# Patient Record
Sex: Male | Born: 1975
Health system: Southern US, Community
[De-identification: ages and names within clinical notes are randomized; demographics above are authoritative.]

## PROBLEM LIST (undated history)

## (undated) DIAGNOSIS — E291 Testicular hypofunction: Secondary | ICD-10-CM

## (undated) DIAGNOSIS — E785 Hyperlipidemia, unspecified: Secondary | ICD-10-CM

## (undated) DIAGNOSIS — K219 Gastro-esophageal reflux disease without esophagitis: Secondary | ICD-10-CM

## (undated) DIAGNOSIS — T7840XA Allergy, unspecified, initial encounter: Secondary | ICD-10-CM

## (undated) HISTORY — PX: OTHER SURGICAL HISTORY: SHX169

## (undated) HISTORY — DX: Allergy, unspecified, initial encounter: T78.40XA

## (undated) HISTORY — DX: Testicular hypofunction: E29.1

## (undated) HISTORY — DX: Hyperlipidemia, unspecified: E78.5

## (undated) HISTORY — DX: Gastro-esophageal reflux disease without esophagitis: K21.9

---

## 1998-02-28 ENCOUNTER — Encounter: Admission: RE | Admit: 1998-02-28 | Discharge: 1998-02-28 | Payer: Self-pay | Admitting: Family Medicine

## 1998-03-04 ENCOUNTER — Encounter: Admission: RE | Admit: 1998-03-04 | Discharge: 1998-03-04 | Payer: Self-pay | Admitting: Family Medicine

## 1998-03-07 ENCOUNTER — Ambulatory Visit (HOSPITAL_COMMUNITY): Admission: RE | Admit: 1998-03-07 | Discharge: 1998-03-07 | Payer: Self-pay | Admitting: *Deleted

## 1998-03-10 ENCOUNTER — Encounter: Admission: RE | Admit: 1998-03-10 | Discharge: 1998-03-10 | Payer: Self-pay | Admitting: Family Medicine

## 1998-03-23 ENCOUNTER — Encounter: Admission: RE | Admit: 1998-03-23 | Discharge: 1998-03-23 | Payer: Self-pay | Admitting: Family Medicine

## 1998-04-18 ENCOUNTER — Encounter: Admission: RE | Admit: 1998-04-18 | Discharge: 1998-04-18 | Payer: Self-pay | Admitting: Family Medicine

## 1998-04-20 ENCOUNTER — Ambulatory Visit (HOSPITAL_COMMUNITY): Admission: RE | Admit: 1998-04-20 | Discharge: 1998-04-20 | Payer: Self-pay | Admitting: Family Medicine

## 1998-04-20 ENCOUNTER — Encounter: Admission: RE | Admit: 1998-04-20 | Discharge: 1998-04-20 | Payer: Self-pay | Admitting: Family Medicine

## 1999-01-23 ENCOUNTER — Encounter: Admission: RE | Admit: 1999-01-23 | Discharge: 1999-01-23 | Payer: Self-pay | Admitting: Sports Medicine

## 1999-11-15 ENCOUNTER — Encounter: Admission: RE | Admit: 1999-11-15 | Discharge: 1999-11-15 | Payer: Self-pay | Admitting: Family Medicine

## 1999-11-22 ENCOUNTER — Encounter: Admission: RE | Admit: 1999-11-22 | Discharge: 1999-11-22 | Payer: Self-pay | Admitting: Family Medicine

## 1999-12-01 ENCOUNTER — Encounter: Admission: RE | Admit: 1999-12-01 | Discharge: 1999-12-01 | Payer: Self-pay | Admitting: Family Medicine

## 2002-06-12 ENCOUNTER — Encounter: Admission: RE | Admit: 2002-06-12 | Discharge: 2002-06-12 | Payer: Self-pay | Admitting: Family Medicine

## 2002-08-07 ENCOUNTER — Encounter: Admission: RE | Admit: 2002-08-07 | Discharge: 2002-08-07 | Payer: Self-pay | Admitting: Family Medicine

## 2002-08-21 ENCOUNTER — Encounter: Admission: RE | Admit: 2002-08-21 | Discharge: 2002-08-21 | Payer: Self-pay | Admitting: Family Medicine

## 2005-01-07 ENCOUNTER — Emergency Department (HOSPITAL_COMMUNITY): Admission: EM | Admit: 2005-01-07 | Discharge: 2005-01-07 | Payer: Self-pay | Admitting: Emergency Medicine

## 2005-01-18 ENCOUNTER — Ambulatory Visit: Payer: Self-pay | Admitting: Family Medicine

## 2005-01-26 ENCOUNTER — Ambulatory Visit (HOSPITAL_COMMUNITY): Admission: RE | Admit: 2005-01-26 | Discharge: 2005-01-26 | Payer: Self-pay | Admitting: Oral Surgery

## 2005-09-07 ENCOUNTER — Ambulatory Visit: Payer: Self-pay | Admitting: Family Medicine

## 2005-11-15 ENCOUNTER — Ambulatory Visit: Payer: Self-pay | Admitting: Family Medicine

## 2005-12-05 ENCOUNTER — Ambulatory Visit: Payer: Self-pay | Admitting: Family Medicine

## 2005-12-12 ENCOUNTER — Ambulatory Visit: Payer: Self-pay | Admitting: Family Medicine

## 2006-01-27 IMAGING — CR DG CHEST 2V
2 series · 2 of 2 positions shown · non-contrast
Comparison: None.

CLINICAL DATA: Chest pain, shortness of breath.  Left-sided numbness.  Non smoker.

[view not recorded (1 of 2)]
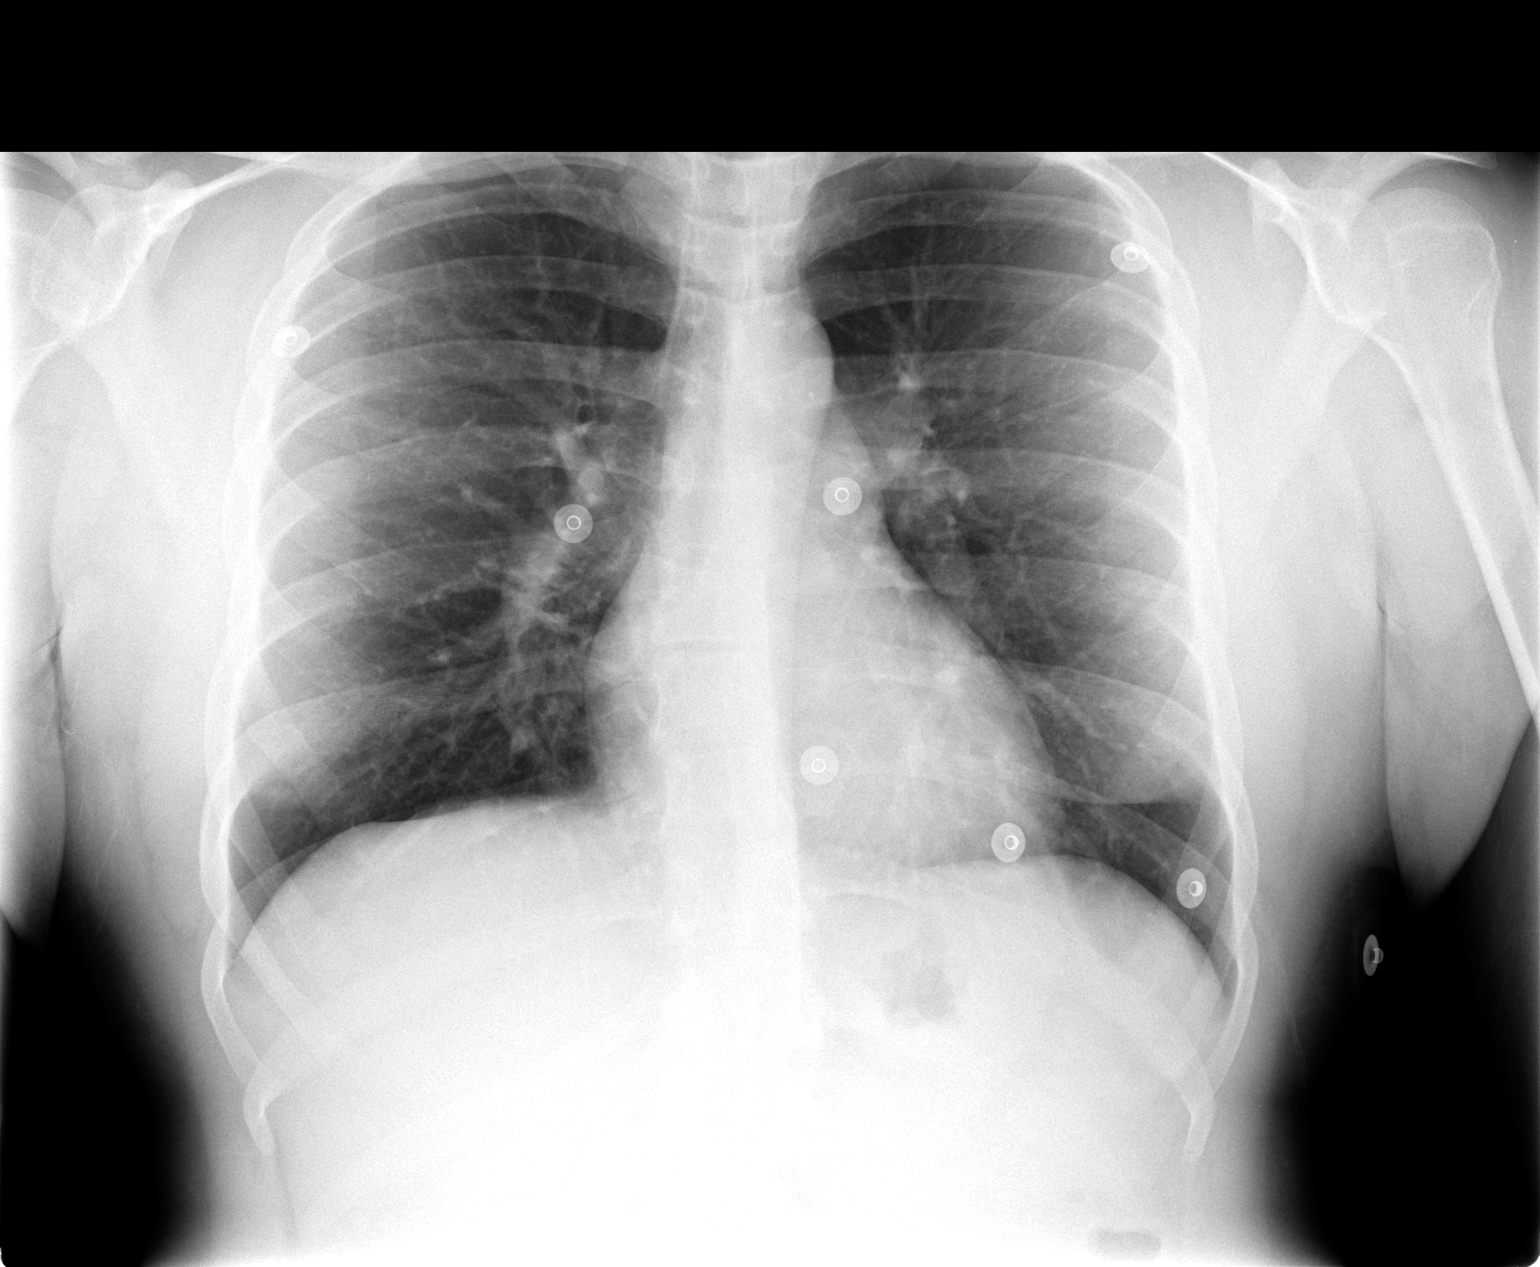

[view not recorded (2 of 2)]
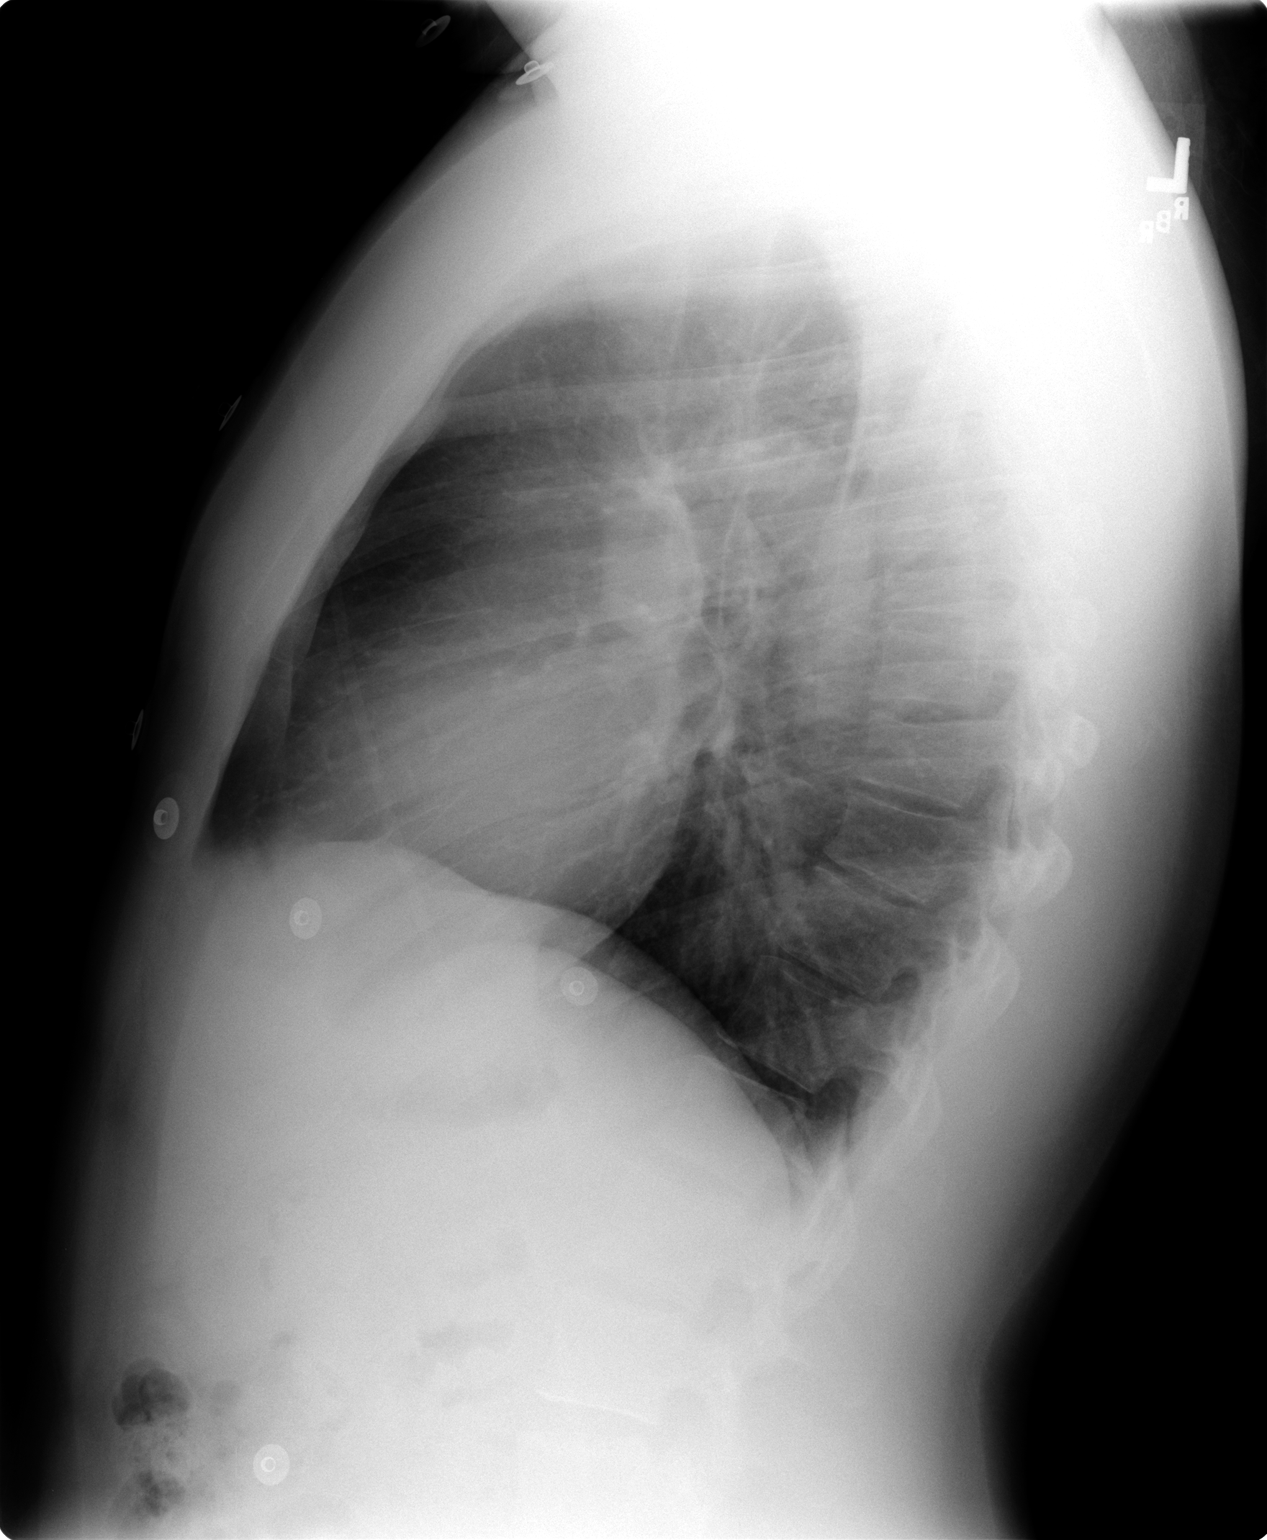

[2 of 2 positions shown; findings below may reference images not displayed]

CHEST - 2 VIEWS:
 There are no focal lung opacities.  No pleural effusions or pneumothorax.  Review of osseous structures is unremarkable.
IMPRESSION: Normal chest.

## 2006-04-29 ENCOUNTER — Ambulatory Visit: Payer: Self-pay | Admitting: Family Medicine

## 2006-08-27 ENCOUNTER — Ambulatory Visit: Payer: Self-pay | Admitting: Family Medicine

## 2006-09-09 ENCOUNTER — Ambulatory Visit: Payer: Self-pay | Admitting: Family Medicine

## 2006-09-09 LAB — CONVERTED CEMR LAB
Chol/HDL Ratio, serum: 5.9
Cholesterol: 208 mg/dL (ref 0–200)
HDL: 35.4 mg/dL — ABNORMAL LOW (ref >39.0)
LDL DIRECT: 146.7 mg/dL
Triglyceride fasting, serum: 84 mg/dL (ref 0–149)
VLDL: 17 mg/dL (ref 0–40)

## 2007-01-30 ENCOUNTER — Emergency Department (HOSPITAL_COMMUNITY): Admission: EM | Admit: 2007-01-30 | Discharge: 2007-01-30 | Payer: Self-pay | Admitting: Emergency Medicine

## 2007-08-22 ENCOUNTER — Ambulatory Visit: Payer: Self-pay | Admitting: Family Medicine

## 2007-08-22 LAB — CONVERTED CEMR LAB
Bilirubin Urine: NEGATIVE
Glucose, Urine, Semiquant: NEGATIVE
Ketones, urine, test strip: NEGATIVE
Nitrite: NEGATIVE
Protein, U semiquant: NEGATIVE
Specific Gravity, Urine: 1.015
Urobilinogen, UA: NEGATIVE
WBC Urine, dipstick: NEGATIVE
pH: >=9

## 2007-08-25 LAB — CONVERTED CEMR LAB
ALT: 21 units/L (ref 0–53)
AST: 23 units/L (ref 0–37)
Albumin: 4.6 g/dL (ref 3.5–5.2)
Alkaline Phosphatase: 35 units/L — ABNORMAL LOW (ref 39–117)
BUN: 11 mg/dL (ref 6–23)
Basophils Absolute: 0 10*3/uL (ref 0.0–0.1)
Basophils Relative: 0.1 % (ref 0.0–1.0)
Bilirubin, Direct: 0.2 mg/dL (ref 0.0–0.3)
CO2: 32 meq/L (ref 19–32)
Calcium: 10.1 mg/dL (ref 8.4–10.5)
Chloride: 105 meq/L (ref 96–112)
Cholesterol: 203 mg/dL (ref 0–200)
Creatinine, Ser: 1.3 mg/dL (ref 0.4–1.5)
Direct LDL: 155.2 mg/dL
Eosinophils Absolute: 0.3 10*3/uL (ref 0.0–0.6)
Eosinophils Relative: 4.9 % (ref 0.0–5.0)
GFR calc Af Amer: 85 mL/min
GFR calc non Af Amer: 70 mL/min
Glucose, Bld: 92 mg/dL (ref 70–99)
HCT: 42.5 % (ref 39.0–52.0)
HDL: 39.2 mg/dL (ref >39.0)
Hemoglobin: 14.4 g/dL (ref 13.0–17.0)
Lymphocytes Relative: 17.9 % (ref 12.0–46.0)
MCHC: 33.8 g/dL (ref 30.0–36.0)
MCV: 89.3 fL (ref 78.0–100.0)
Monocytes Absolute: 0.5 10*3/uL (ref 0.2–0.7)
Monocytes Relative: 6.9 % (ref 3.0–11.0)
Neutro Abs: 5 10*3/uL (ref 1.4–7.7)
Neutrophils Relative %: 70.2 % (ref 43.0–77.0)
Platelets: 250 10*3/uL (ref 150–400)
Potassium: 4.3 meq/L (ref 3.5–5.1)
RBC: 4.76 M/uL (ref 4.22–5.81)
RDW: 12.5 % (ref 11.5–14.6)
Sodium: 141 meq/L (ref 135–145)
TSH: 1.25 microintl units/mL (ref 0.35–5.50)
Total Bilirubin: 1.2 mg/dL (ref 0.3–1.2)
Total CHOL/HDL Ratio: 5.2
Total Protein: 7 g/dL (ref 6.0–8.3)
Triglycerides: 57 mg/dL (ref 0–149)
VLDL: 11 mg/dL (ref 0–40)
WBC: 7.1 10*3/uL (ref 4.5–10.5)

## 2007-09-02 DIAGNOSIS — E785 Hyperlipidemia, unspecified: Secondary | ICD-10-CM | POA: Insufficient documentation

## 2007-10-06 ENCOUNTER — Ambulatory Visit: Payer: Self-pay | Admitting: Family Medicine

## 2007-10-06 DIAGNOSIS — J309 Allergic rhinitis, unspecified: Secondary | ICD-10-CM | POA: Insufficient documentation

## 2007-10-06 DIAGNOSIS — Z87442 Personal history of urinary calculi: Secondary | ICD-10-CM | POA: Insufficient documentation

## 2007-10-06 DIAGNOSIS — Z9189 Other specified personal risk factors, not elsewhere classified: Secondary | ICD-10-CM | POA: Insufficient documentation

## 2008-01-07 ENCOUNTER — Ambulatory Visit: Payer: Self-pay | Admitting: Family Medicine

## 2008-01-07 LAB — CONVERTED CEMR LAB
ALT: 25 units/L (ref 0–53)
AST: 23 units/L (ref 0–37)
Albumin: 4.5 g/dL (ref 3.5–5.2)
Alkaline Phosphatase: 32 units/L — ABNORMAL LOW (ref 39–117)
Bilirubin, Direct: 0.2 mg/dL (ref 0.0–0.3)
Cholesterol: 143 mg/dL (ref 0–200)
HDL: 38 mg/dL — ABNORMAL LOW (ref >39.0)
LDL Cholesterol: 96 mg/dL (ref 0–99)
Total Bilirubin: 1.2 mg/dL (ref 0.3–1.2)
Total CHOL/HDL Ratio: 3.8
Total Protein: 7.2 g/dL (ref 6.0–8.3)
Triglycerides: 43 mg/dL (ref 0–149)
VLDL: 9 mg/dL (ref 0–40)

## 2008-05-13 ENCOUNTER — Ambulatory Visit: Payer: Self-pay | Admitting: Family Medicine

## 2008-05-13 DIAGNOSIS — R079 Chest pain, unspecified: Secondary | ICD-10-CM

## 2008-05-31 ENCOUNTER — Telehealth: Payer: Self-pay | Admitting: Internal Medicine

## 2008-06-09 ENCOUNTER — Telehealth: Payer: Self-pay | Admitting: Family Medicine

## 2009-02-10 ENCOUNTER — Ambulatory Visit: Payer: Self-pay | Admitting: Family Medicine

## 2009-02-16 ENCOUNTER — Encounter: Payer: Self-pay | Admitting: Family Medicine

## 2009-02-16 LAB — CONVERTED CEMR LAB
ALT: 49 units/L (ref 0–53)
AST: 27 units/L (ref 0–37)
Albumin: 4.8 g/dL (ref 3.5–5.2)
Alkaline Phosphatase: 38 units/L — ABNORMAL LOW (ref 39–117)
BUN: 14 mg/dL (ref 6–23)
Basophils Absolute: 0 10*3/uL (ref 0.0–0.1)
Basophils Relative: 0.4 % (ref 0.0–3.0)
Bilirubin Urine: NEGATIVE
Bilirubin, Direct: 0.2 mg/dL (ref 0.0–0.3)
CO2: 33 meq/L — ABNORMAL HIGH (ref 19–32)
Calcium: 10.2 mg/dL (ref 8.4–10.5)
Chloride: 102 meq/L (ref 96–112)
Cholesterol: 173 mg/dL (ref 0–200)
Creatinine, Ser: 1.2 mg/dL (ref 0.4–1.5)
Eosinophils Absolute: 0.2 10*3/uL (ref 0.0–0.7)
Eosinophils Relative: 3.7 % (ref 0.0–5.0)
GFR calc non Af Amer: 91.21 mL/min (ref >60)
Glucose, Bld: 102 mg/dL — ABNORMAL HIGH (ref 70–99)
HCT: 45.2 % (ref 39.0–52.0)
HDL: 40.5 mg/dL (ref >39.00)
Hemoglobin, Urine: NEGATIVE
Hemoglobin: 15.2 g/dL (ref 13.0–17.0)
Ketones, ur: NEGATIVE mg/dL
LDL Cholesterol: 122 mg/dL — ABNORMAL HIGH (ref 0–99)
Leukocytes, UA: NEGATIVE
Lymphocytes Relative: 24.2 % (ref 12.0–46.0)
Lymphs Abs: 1.4 10*3/uL (ref 0.7–4.0)
MCHC: 33.6 g/dL (ref 30.0–36.0)
MCV: 88.6 fL (ref 78.0–100.0)
Monocytes Absolute: 0.4 10*3/uL (ref 0.1–1.0)
Monocytes Relative: 7.4 % (ref 3.0–12.0)
Neutro Abs: 3.9 10*3/uL (ref 1.4–7.7)
Neutrophils Relative %: 64.3 % (ref 43.0–77.0)
Nitrite: NEGATIVE
Platelets: 204 10*3/uL (ref 150.0–400.0)
Potassium: 4.2 meq/L (ref 3.5–5.1)
RBC: 5.1 M/uL (ref 4.22–5.81)
RDW: 12.5 % (ref 11.5–14.6)
Sodium: 140 meq/L (ref 135–145)
Specific Gravity, Urine: 1.015 (ref 1.000–1.030)
TSH: 1.04 microintl units/mL (ref 0.35–5.50)
Total Bilirubin: 1 mg/dL (ref 0.3–1.2)
Total CHOL/HDL Ratio: 4
Total Protein, Urine: NEGATIVE mg/dL
Total Protein: 7.7 g/dL (ref 6.0–8.3)
Triglycerides: 55 mg/dL (ref 0.0–149.0)
Urine Glucose: NEGATIVE mg/dL
Urobilinogen, UA: 0.2 (ref 0.0–1.0)
VLDL: 11 mg/dL (ref 0.0–40.0)
WBC: 5.9 10*3/uL (ref 4.5–10.5)
pH: 6 (ref 5.0–8.0)

## 2009-04-21 ENCOUNTER — Ambulatory Visit: Payer: Self-pay | Admitting: Family Medicine

## 2009-04-21 DIAGNOSIS — K219 Gastro-esophageal reflux disease without esophagitis: Secondary | ICD-10-CM | POA: Insufficient documentation

## 2009-04-21 DIAGNOSIS — G4730 Sleep apnea, unspecified: Secondary | ICD-10-CM | POA: Insufficient documentation

## 2009-04-21 DIAGNOSIS — N433 Hydrocele, unspecified: Secondary | ICD-10-CM | POA: Insufficient documentation

## 2009-05-23 ENCOUNTER — Ambulatory Visit: Payer: Self-pay | Admitting: Family Medicine

## 2009-05-23 DIAGNOSIS — N410 Acute prostatitis: Secondary | ICD-10-CM | POA: Insufficient documentation

## 2009-05-23 LAB — CONVERTED CEMR LAB
Bilirubin Urine: NEGATIVE
Blood in Urine, dipstick: NEGATIVE
Glucose, Urine, Semiquant: NEGATIVE
Ketones, urine, test strip: NEGATIVE
Nitrite: NEGATIVE
Specific Gravity, Urine: 1.02
Urobilinogen, UA: 0.2
WBC Urine, dipstick: NEGATIVE
pH: 6

## 2009-09-14 ENCOUNTER — Telehealth: Payer: Self-pay | Admitting: Family Medicine

## 2009-11-02 ENCOUNTER — Telehealth (INDEPENDENT_AMBULATORY_CARE_PROVIDER_SITE_OTHER): Payer: Self-pay | Admitting: *Deleted

## 2009-11-18 ENCOUNTER — Ambulatory Visit: Payer: Self-pay | Admitting: Family Medicine

## 2009-11-23 ENCOUNTER — Encounter: Payer: Self-pay | Admitting: Family Medicine

## 2010-05-08 ENCOUNTER — Ambulatory Visit: Payer: Self-pay | Admitting: Family Medicine

## 2010-05-08 DIAGNOSIS — N508 Other specified disorders of male genital organs: Secondary | ICD-10-CM | POA: Insufficient documentation

## 2010-05-08 LAB — CONVERTED CEMR LAB
Bilirubin Urine: NEGATIVE
Blood in Urine, dipstick: NEGATIVE
Glucose, Urine, Semiquant: NEGATIVE
Ketones, urine, test strip: NEGATIVE
Nitrite: NEGATIVE
Protein, U semiquant: NEGATIVE
Specific Gravity, Urine: >=1.03
Urobilinogen, UA: 0.2
WBC Urine, dipstick: NEGATIVE
pH: 5.5

## 2010-05-09 LAB — CONVERTED CEMR LAB
ALT: 31 units/L (ref 0–53)
AST: 23 units/L (ref 0–37)
Albumin: 4.9 g/dL (ref 3.5–5.2)
Alkaline Phosphatase: 41 units/L (ref 39–117)
BUN: 18 mg/dL (ref 6–23)
Basophils Absolute: 0 10*3/uL (ref 0.0–0.1)
Basophils Relative: 0.4 % (ref 0.0–3.0)
Bilirubin, Direct: 0.2 mg/dL (ref 0.0–0.3)
CO2: 30 meq/L (ref 19–32)
Calcium: 9.7 mg/dL (ref 8.4–10.5)
Chloride: 104 meq/L (ref 96–112)
Cholesterol: 161 mg/dL (ref 0–200)
Creatinine, Ser: 1.3 mg/dL (ref 0.4–1.5)
Eosinophils Absolute: 0.3 10*3/uL (ref 0.0–0.7)
Eosinophils Relative: 5.4 % — ABNORMAL HIGH (ref 0.0–5.0)
GFR calc non Af Amer: 85.52 mL/min (ref >60)
Glucose, Bld: 95 mg/dL (ref 70–99)
HCT: 43.1 % (ref 39.0–52.0)
HDL: 41.2 mg/dL (ref >39.00)
Hemoglobin: 14.6 g/dL (ref 13.0–17.0)
LDL Cholesterol: 106 mg/dL — ABNORMAL HIGH (ref 0–99)
Lymphocytes Relative: 25.8 % (ref 12.0–46.0)
Lymphs Abs: 1.5 10*3/uL (ref 0.7–4.0)
MCHC: 33.8 g/dL (ref 30.0–36.0)
MCV: 89.3 fL (ref 78.0–100.0)
Monocytes Absolute: 0.4 10*3/uL (ref 0.1–1.0)
Monocytes Relative: 7.4 % (ref 3.0–12.0)
Neutro Abs: 3.6 10*3/uL (ref 1.4–7.7)
Neutrophils Relative %: 61 % (ref 43.0–77.0)
Platelets: 233 10*3/uL (ref 150.0–400.0)
Potassium: 4.1 meq/L (ref 3.5–5.1)
RBC: 4.83 M/uL (ref 4.22–5.81)
RDW: 13.7 % (ref 11.5–14.6)
Sodium: 141 meq/L (ref 135–145)
TSH: 1.62 microintl units/mL (ref 0.35–5.50)
Total Bilirubin: 1.1 mg/dL (ref 0.3–1.2)
Total CHOL/HDL Ratio: 4
Total Protein: 7.8 g/dL (ref 6.0–8.3)
Triglycerides: 70 mg/dL (ref 0.0–149.0)
VLDL: 14 mg/dL (ref 0.0–40.0)
WBC: 6 10*3/uL (ref 4.5–10.5)

## 2010-05-12 ENCOUNTER — Encounter: Admission: RE | Admit: 2010-05-12 | Discharge: 2010-05-12 | Payer: Self-pay | Admitting: Family Medicine

## 2010-11-27 ENCOUNTER — Ambulatory Visit
Admission: RE | Admit: 2010-11-27 | Discharge: 2010-11-27 | Payer: Self-pay | Source: Home / Self Care | Attending: Family Medicine | Admitting: Family Medicine

## 2010-12-04 ENCOUNTER — Encounter: Payer: Self-pay | Admitting: Family Medicine

## 2010-12-12 NOTE — Assessment & Plan Note (Signed)
Summary: cpx/jls rsc with pt/mhf   Vital Signs:  Patient profile:   35 year old male Height:      66.5 inches Weight:      198 pounds BMI:     31.59 Temp:     97.6 degrees F oral Pulse rate:   95 / minute BP sitting:   122 / 74  (left arm) Cuff size:   large  Vitals Entered By: Alfred Levins, CMA (April 21, 2009 8:56 AM) CC: cpx   History of Present Illness: 35 yr old male for a cpx. he has put on some weight, and he plans to eat smarter and get more exercise this summer. I asked him about the hydrocele in his scrotum. He thinks it is stable and it never bothers him, but he was about 35 years old the last time it was looked at by [place]. Also, he has times when he wakes up feeling like he is SOB, then this passes quickly when he sits up. He snores some. No chest pains. No trouble during the day even if he exerts himself. GERD is well controlled.   Allergies (verified): No Known Drug Allergies  Past History:  Past Medical History: Hyperlipidemia Allergic rhinitis GERD  Past Surgical History: Reviewed history from 10/06/2007 and no changes required. Bilateral inguinal hernia repair  Family History: Reviewed history from 10/06/2007 and no changes required. Family History Diabetes 1st degree relative Family History High cholesterol Family History Hypertension brother has sleep apnea  Social History: Reviewed history from 10/06/2007 and no changes required. Single Never Smoked Alcohol use-yes Drug use-no  Review of Systems  The patient denies anorexia, fever, weight loss, weight gain, vision loss, decreased hearing, hoarseness, chest pain, syncope, dyspnea on exertion, peripheral edema, prolonged cough, headaches, hemoptysis, abdominal pain, melena, hematochezia, severe indigestion/heartburn, hematuria, incontinence, genital sores, muscle weakness, suspicious skin lesions, transient blindness, difficulty walking, depression, unusual weight change, abnormal bleeding, enlarged  lymph nodes, angioedema, breast masses, and testicular masses.    Physical Exam  General:  Well-developed,well-nourished,in no acute distress; alert,appropriate and cooperative throughout examination Head:  Normocephalic and atraumatic without obvious abnormalities. No apparent alopecia or balding. Eyes:  No corneal or conjunctival inflammation noted. EOMI. Perrla. Funduscopic exam benign, without hemorrhages, exudates or papilledema. Vision grossly normal. Ears:  External ear exam shows no significant lesions or deformities.  Otoscopic examination reveals clear canals, tympanic membranes are intact bilaterally without bulging, retraction, inflammation or discharge. Hearing is grossly normal bilaterally. Nose:  External nasal examination shows no deformity or inflammation. Nasal mucosa are pink and moist without lesions or exudates. Mouth:  Oral mucosa and oropharynx without lesions or exudates.  Teeth in good repair. Neck:  No deformities, masses, or tenderness noted. Chest Wall:  No deformities, masses, tenderness or gynecomastia noted. Lungs:  Normal respiratory effort, chest expands symmetrically. Lungs are clear to auscultation, no crackles or wheezes. Heart:  Normal rate and regular rhythm. S1 and S2 normal without gallop, murmur, click, rub or other extra sounds. Abdomen:  Bowel sounds positive,abdomen soft and non-tender without masses, organomegaly or hernias noted. Genitalia:  Testes bilaterally descended without nodularity, tenderness or masses. No scrotal masses but there is a large fluid collection in the left side.  No penis lesions or urethral discharge. Msk:  No deformity or scoliosis noted of thoracic or lumbar spine.   Pulses:  R and L carotid,radial,femoral,dorsalis pedis and posterior tibial pulses are full and equal bilaterally Extremities:  No clubbing, cyanosis, edema, or deformity noted with normal full  range of motion of all joints.   Neurologic:  No cranial nerve deficits  noted. Station and gait are normal. Plantar reflexes are down-going bilaterally. DTRs are symmetrical throughout. Sensory, motor and coordinative functions appear intact. Skin:  Intact without suspicious lesions or rashes Cervical Nodes:  No lymphadenopathy noted Axillary Nodes:  No palpable lymphadenopathy Inguinal Nodes:  No significant adenopathy Psych:  Cognition and judgment appear intact. Alert and cooperative with normal attention span and concentration. No apparent delusions, illusions, hallucinations   Impression & Recommendations:  Problem # 1:  WELL ADULT EXAM (ICD-V70.0)  Problem # 2:  HYDROCELE (ICD-603.9)  Orders: Radiology Referral (Radiology)  Problem # 3:  SLEEP APNEA (ICD-780.57)  Orders: Pulmonary Referral (Pulmonary)  Complete Medication List: 1)  Crestor 10 Mg Tabs (Rosuvastatin calcium) .... Once daily 2)  Nexium 40 Mg Cpdr (Esomeprazole magnesium) .... Once daily  Patient Instructions: 1)  Please schedule a follow-up appointment in 6 months .  2)  It is important that you exercise reguarly at least 20 minutes 5 times a week. If you develop chest pain, have severe difficulty breathing, or feel very tired, stop exercising immediately and seek medical attention.  3)  You need to lose weight. Consider a lower calorie diet and regular exercise.  4)  Set up a scrotal US. 5)  Refer to Pulmonary for possible sleep apnea. Prescriptions: NEXIUM 40 MG  CPDR (ESOMEPRAZOLE MAGNESIUM) once daily  #30 x 11   Entered and Authorized by:   Nelwyn Salisbury MD   Signed by:   Nelwyn Salisbury MD on 04/21/2009   Method used:   [facility]        [facility]  [address] [phone]* (retail)       [address]       [place], [place]  [address]       Ph: [phone] or [phone]       Fax: [phone]   RxID:   [id number] CRESTOR 10 MG  TABS (ROSUVASTATIN CALCIUM) once daily  #30 x 11   Entered and Authorized by:   Nelwyn Salisbury MD   Signed by:   Nelwyn Salisbury MD on 04/21/2009   Method used:   [facility]        [facility]  [address] [phone]* (retail)       [address]       [place], [place]  [address]       Ph: [phone] or [phone]       Fax: [phone]   RxID:   [id number]

## 2010-12-12 NOTE — Assessment & Plan Note (Signed)
Summary: 3 month ov pt to come fasting/nta   Vital Signs:  Patient Profile:   35 Years Old Male Height:     68 inches Weight:      187 pounds Temp:     97.6 degrees F oral Pulse rate:   72 / minute Pulse rhythm:   regular BP sitting:   124 / 80  (left arm) Cuff size:   large  Vitals Entered By: Alfred Levins, CMA (January 07, 2008 8:50 AM)                 Chief Complaint:  f/u and fasting.  History of Present Illness: Has been on Crestor for the past 3 months. Doing well and watching diet.    Current Allergies: No known allergies   Past Medical History:    Reviewed history from 10/06/2007 and no changes required:       Hyperlipidemia       Allergic rhinitis     Review of Systems  The patient denies anorexia, fever, weight loss, weight gain, vision loss, decreased hearing, hoarseness, chest pain, syncope, dyspnea on exhertion, peripheral edema, prolonged cough, hemoptysis, abdominal pain, melena, hematochezia, severe indigestion/heartburn, hematuria, incontinence, genital sores, muscle weakness, suspicious skin lesions, transient blindness, difficulty walking, depression, unusual weight change, abnormal bleeding, enlarged lymph nodes, angioedema, breast masses, and testicular masses.     Physical Exam  General:     Well-developed,well-nourished,in no acute distress; alert,appropriate and cooperative throughout examination    Impression & Recommendations:  Problem # 1:  HYPERLIPIDEMIA (ICD-272.4)  His updated medication list for this problem includes:    Crestor 10 Mg Tabs (Rosuvastatin calcium) ..... Once daily  Orders: Venipuncture ([address]) TLB-Lipid Panel (80061-LIPID) TLB-Hepatic/Liver Function Pnl (80076-HEPATIC)   Complete Medication List: 1)  Crestor 10 Mg Tabs (Rosuvastatin calcium) .... Once daily   Patient Instructions: 1)  Follow up per results.    ]

## 2010-12-12 NOTE — Progress Notes (Signed)
Summary: questions regarding meds.  Phone Note Call from Patient   Caller: Patient Call For: Nelwyn Salisbury MD Summary of Call: Pt.  wants to know if it is ok to take [facility] with his other meds? [phone] Initial call taken by: Lynann Beaver CMA,  September 14, 2009 8:24 AM  Follow-up for Phone Call        yes that would be okay Follow-up by: Nelwyn Salisbury MD,  September 14, 2009 4:56 PM  Additional Follow-up for Phone Call Additional follow up Details #1::        Pt notified. Additional Follow-up by: Lynann Beaver CMA,  September 15, 2009 8:13 AM

## 2010-12-12 NOTE — Progress Notes (Signed)
Summary: medication change  Phone Note Call from Patient   Caller: Patient Call For: Nelwyn Salisbury MD Summary of Call: Pt. needs Nexium changed to Omeprazole, Pantoprazole or Lansoprazole for insurance purposes. [facility]  Initial call taken by: Lynann Beaver CMA,  November 02, 2009 8:52 AM  Follow-up for Phone Call        change to Omeprazole 40 mg once daily , call in one year supply Follow-up by: Nelwyn Salisbury MD,  November 07, 2009 9:55 AM  Additional Follow-up for Phone Call Additional follow up Details #1::        rx sent. left message on machine for patient  Additional Follow-up by: Kern Reap CMA Duncan Dull),  November 07, 2009 1:28 PM    New/Updated Medications: OMEPRAZOLE 40 MG CPDR (OMEPRAZOLE) take one tab by mouth qd Prescriptions: OMEPRAZOLE 40 MG CPDR (OMEPRAZOLE) take one tab by mouth qd  #90 x 3   Entered by:   Kern Reap CMA (AAMA)   Authorized by:   Nelwyn Salisbury MD   Signed by:   Kern Reap CMA (AAMA) on 11/07/2009   Method used:   [facility]        [facility]  [address] [phone][facility] (retail)       [address]       [place], [place]  [address]       Ph: [phone] or [phone]       Fax: [phone]   RxID:   [id number]   Appended Document: medication change Called to find out which med Dr. Clent Ridges called in.

## 2010-12-12 NOTE — Assessment & Plan Note (Signed)
Summary: CPX/CCM/PT RESCD/CCM   Vital Signs:  Patient Profile:   35 Years Old Male Height:     68 inches Weight:      178 pounds BMI:     34.79 BSA:     2.16 Temp:     98.8 degrees F oral Pulse rate:   111 / minute BP sitting:   124 / 76  (left arm) Cuff size:   large  Vitals Entered By: Alfred Levins, CMA (October 06, 2007 3:24 PM)                 Chief Complaint:  cpx.  History of Present Illness: 35 yr old male for cpx. Was to have been on Crestor but stopped it 9 months ago to try a diet and exercise program instead. Feels good.  Current Allergies: No known allergies   Past Medical History:    Reviewed history from 09/02/2007 and no changes required:       Hyperlipidemia       Allergic rhinitis  Past Surgical History:    Reviewed history and no changes required:       Bilateral inguinal hernia repair   Family History:    Reviewed history and no changes required:       Family History Diabetes 1st degree relative       Family History High cholesterol       Family History Hypertension  Social History:    Reviewed history and no changes required:       Single       Never Smoked       Alcohol use-yes       Drug use-no   Risk Factors:  Tobacco use:  never Drug use:  no Alcohol use:  yes   Review of Systems  The patient denies anorexia, fever, weight loss, vision loss, decreased hearing, hoarseness, chest pain, syncope, dyspnea on exhertion, peripheral edema, prolonged cough, hemoptysis, abdominal pain, melena, hematochezia, severe indigestion/heartburn, hematuria, incontinence, genital sores, muscle weakness, suspicious skin lesions, transient blindness, difficulty walking, depression, unusual weight change, abnormal bleeding, enlarged lymph nodes, angioedema, breast masses, and testicular masses.     Physical Exam  General:     Well-developed,well-nourished,in no acute distress; alert,appropriate and cooperative throughout examination Head:  Normocephalic and atraumatic without obvious abnormalities. No apparent alopecia or balding. Eyes:     No corneal or conjunctival inflammation noted. EOMI. Perrla. Funduscopic exam benign, without hemorrhages, exudates or papilledema. Vision grossly normal. Ears:     External ear exam shows no significant lesions or deformities.  Otoscopic examination reveals clear canals, tympanic membranes are intact bilaterally without bulging, retraction, inflammation or discharge. Hearing is grossly normal bilaterally. Nose:     External nasal examination shows no deformity or inflammation. Nasal mucosa are pink and moist without lesions or exudates. Mouth:     Oral mucosa and oropharynx without lesions or exudates.  Teeth in good repair. Neck:     No deformities, masses, or tenderness noted. Lungs:     Normal respiratory effort, chest expands symmetrically. Lungs are clear to auscultation, no crackles or wheezes. Heart:     Normal rate and regular rhythm. S1 and S2 normal without gallop, murmur, click, rub or other extra sounds. Abdomen:     Bowel sounds positive,abdomen soft and non-tender without masses, organomegaly or hernias noted. Genitalia:     Testes bilaterally descended without nodularity, tenderness or masses. No scrotal masses . No penis lesions or urethral discharge. Has a cystocele on left  scrotum. Msk:     No deformity or scoliosis noted of thoracic or lumbar spine.   Pulses:     R and L carotid,radial,femoral,dorsalis pedis and posterior tibial pulses are full and equal bilaterally Extremities:     No clubbing, cyanosis, edema, or deformity noted with normal full range of motion of all joints.   Neurologic:     No cranial nerve deficits noted. Station and gait are normal. Plantar reflexes are down-going bilaterally. DTRs are symmetrical throughout. Sensory, motor and coordinative functions appear intact. Skin:     Intact without suspicious lesions or rashes Cervical Nodes:     No  lymphadenopathy noted Axillary Nodes:     No palpable lymphadenopathy Inguinal Nodes:     No significant adenopathy Psych:     Cognition and judgment appear intact. Alert and cooperative with normal attention span and concentration. No apparent delusions, illusions, hallucinations     Impression & Recommendations:  Problem # 1:  WELL ADULT EXAM (ICD-V70.0)  Complete Medication List: 1)  Crestor 10 Mg Tabs (Rosuvastatin calcium) .... Once daily   Patient Instructions: 1)  Please schedule a follow-up appointment in 3 months.    Prescriptions: CRESTOR 10 MG  TABS (ROSUVASTATIN CALCIUM) once daily  #30 x 11   Entered and Authorized by:   Nelwyn Salisbury MD   Signed by:   Nelwyn Salisbury MD on 10/06/2007   Method used:   [facility] ...       [facility]  [address]  [id number]       [address]       [place], [place]  [address]       Ph: [phone] or [phone]       Fax: [phone]   RxID:   [id number]  ]

## 2010-12-12 NOTE — Letter (Signed)
Summary: Lipid Letter  [place] [facility]  [address] [place], [place] [address]   Phone: [phone]  Fax: [phone]    02/16/2009  Brent Watson [address] [place], [place]  [address]  Dear Brent Watson:  We have carefully reviewed your last lipid profile from 02/10/2009 and the results are noted below with a summary of recommendations for lipid management.    Cholesterol:       173     Goal: <   HDL "good" Cholesterol:   57.84     Goal: >   LDL "bad" Cholesterol:   122     Goal: <   Triglycerides:       55.0     Goal: <    Labs aren normal except mildly high chol. Please watch your diet.    TLC Diet (Therapeutic Lifestyle Change): Saturated Fats & Transfatty acids should be kept < 7% of total calories ***Reduce Saturated Fats Polyunstaurated Fat can be up to 10% of total calories Monounsaturated Fat Fat can be up to 20% of total calories Total Fat should be no greater than 25-35% of total calories Carbohydrates should be 50-60% of total calories Protein should be approximately 15% of total calories Fiber should be at least 20-30 grams a day ***Increased fiber may help lower LDL Total Cholesterol should be < 200mg /day Consider adding plant stanol/sterols to diet (example: Benacol spread) ***A higher intake of unsaturated fat may reduce Triglycerides and Increase HDL    Adjunctive Measures (may lower LIPIDS and reduce risk of Heart Attack) include: Aerobic Exercise (20-30 minutes 3-4 times a week) Limit Alcohol Consumption Weight Reduction Aspirin 75-81 mg a day by mouth (if not allergic or contraindicated) Dietary Fiber 20-30 grams a day by mouth    If you have any questions, please call. We appreciate being able to work with you.   Sincerely,    Brent Watson at [facility], MD

## 2010-12-12 NOTE — Assessment & Plan Note (Signed)
Summary: lower back pain/LC   Vital Signs:  Patient profile:   35 year old male Weight:      198 pounds Temp:     98.0 degrees F oral Pulse rate:   91 / minute BP sitting:   124 / 90  (left arm) Cuff size:   large  Vitals Entered By: Alfred Levins, CMA (May 23, 2009 10:36 AM) CC: lower back pain, frequent urination.    History of Present Illness: For 2 weeks has had a dull ache in the lower back, a strong odor to the urine, and some increased frequency to urinate. No burning or fever or DC.   Allergies (verified): No Known Drug Allergies  Past History:  Past Medical History: Reviewed history from 04/21/2009 and no changes required. Hyperlipidemia Allergic rhinitis GERD  Review of Systems  The patient denies anorexia, fever, weight loss, weight gain, vision loss, decreased hearing, hoarseness, chest pain, syncope, dyspnea on exertion, peripheral edema, prolonged cough, headaches, hemoptysis, abdominal pain, melena, hematochezia, severe indigestion/heartburn, hematuria, incontinence, genital sores, muscle weakness, suspicious skin lesions, transient blindness, difficulty walking, depression, unusual weight change, abnormal bleeding, enlarged lymph nodes, angioedema, breast masses, and testicular masses.    Physical Exam  General:  Well-developed,well-nourished,in no acute distress; alert,appropriate and cooperative throughout examination Abdomen:  Bowel sounds positive,abdomen soft and non-tender without masses, organomegaly or hernias noted. Rectal:  No external abnormalities noted. Normal sphincter tone. No rectal masses. Genitalia:  Testes bilaterally descended without nodularity, tenderness or masses. No scrotal masses or lesions. No penis lesions or urethral discharge. Prostate:  no nodules, no asymmetry, tender, and 1+ enlarged.     Impression & Recommendations:  Problem # 1:  PROSTATITIS, ACUTE (ICD-601.0)  Orders: UA Dipstick w/o Micro (manual)  ([address])  Complete Medication List: 1)  Crestor 10 Mg Tabs (Rosuvastatin calcium) .... Once daily 2)  Nexium 40 Mg Cpdr (Esomeprazole magnesium) .... Once daily 3)  Doxycycline Hyclate 100 Mg Caps (Doxycycline hyclate) .... Two times a day  Patient Instructions: 1)  Please schedule a follow-up appointment as needed .  Prescriptions: DOXYCYCLINE HYCLATE 100 MG CAPS (DOXYCYCLINE HYCLATE) two times a day  #28 x 0   Entered and Authorized by:   Nelwyn Salisbury MD   Signed by:   Nelwyn Salisbury MD on 05/23/2009   Method used:   [facility] to        [facility]  [address] [phone][facility] (retail)       [address]       [place], [place]  [address]       Ph: [phone] or [phone]       Fax: [phone]   RxID:   [id number]   Laboratory Results   Urine Tests    Routine Urinalysis   Color: yellow Appearance: Clear Glucose: negative   (Normal Range: Negative) Bilirubin: negative   (Normal Range: Negative) Ketone: negative   (Normal Range: Negative) Spec. Gravity: 1.020   (Normal Range: 1.003-1.035) Blood: negative   (Normal Range: Negative) pH: 6.0   (Normal Range: 5.0-8.0) Protein: trace   (Normal Range: Negative) Urobilinogen: 0.2   (Normal Range: 0-1) Nitrite: negative   (Normal Range: Negative) Leukocyte Esterace: negative   (Normal Range: Negative)    Comments: Alfred Levins, CMA  May 23, 2009 10:43 AM

## 2010-12-12 NOTE — Medication Information (Signed)
Summary: Coverage Approval for Crestor/[facility]  Coverage Approval for Crestor/[facility]   Imported By: Maryln Gottron 11/29/2009 13:48:17  _____________________________________________________________________  External Attachment:    Type:   Image     Comment:   External Document

## 2010-12-12 NOTE — Progress Notes (Signed)
Summary: sinus infection  Phone Note Call from Patient   Caller: Patient Call For: Dr. Fabian Sharp Summary of Call: Pt. is complaining of sinus headache, sore throat, bloody mucus, no fever, but feeling really bad. Would like Rx called in or an office visit. [facility] [phone] Initial call taken by: Lynann Beaver CMA,  May 31, 2008 9:20 AM  Follow-up for Phone Call        Spoke with patient and he state that he has had sinus congestion for about 1 week with drainage and bloody mucous.  He has tried OTC with no relief Follow-up by: Kern Reap CMA,  May 31, 2008 12:08 PM  Additional Follow-up for Phone Call Additional follow up Details #1::        pt will try mucinex D and saline nasal wash and will come in Friday if not better Additional Follow-up by: Kern Reap CMA,  May 31, 2008 1:32 PM
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
## 2010-12-12 NOTE — Assessment & Plan Note (Signed)
Summary: shortness of breath//ccm   Vital Signs:  Patient profile:   35 year old male Weight:      205 pounds O2 Sat:      98 % on Room air Temp:     97.8 degrees F oral Pulse rate:   82 / minute BP sitting:   128 / 88  (left arm) Cuff size:   large  Vitals Entered By: Alfred Levins, CMA (November 18, 2009 11:52 AM)  O2 Flow:  Room air CC: pt got sob on new years and it lasted 30-45 mins, no other symptons   History of Present Illness: Here to discuss an episode of a sueezing sensation and SOB he experienced on New Years Eve that lasted about 45 minutes. No nausea or sweats. He has GERD and he takes one Nexium every morning. That night he had had a lot of spicy food and some alcohol. He has felt fine ever since. he works out at [employer] 4 days a week with no difficulty.  Current Medications (verified): 1)  Crestor 10 Mg  Tabs (Rosuvastatin Calcium) .... Once Daily 2)  Omeprazole 40 Mg Cpdr (Omeprazole) .... Take One Tab By Mouth Qd  Allergies (verified): No Known Drug Allergies  Past History:  Past Medical History: Reviewed history from 04/21/2009 and no changes required. Hyperlipidemia Allergic rhinitis GERD  Past Surgical History: Reviewed history from 10/06/2007 and no changes required. Bilateral inguinal hernia repair  Review of Systems  The patient denies anorexia, fever, weight loss, weight gain, vision loss, decreased hearing, hoarseness, chest pain, syncope, dyspnea on exertion, peripheral edema, prolonged cough, headaches, hemoptysis, abdominal pain, melena, hematochezia, severe indigestion/heartburn, hematuria, incontinence, genital sores, muscle weakness, suspicious skin lesions, transient blindness, difficulty walking, depression, unusual weight change, abnormal bleeding, enlarged lymph nodes, angioedema, breast masses, and testicular masses.    Physical Exam  General:  Well-developed,well-nourished,in no acute distress; alert,appropriate and cooperative  throughout examination Neck:  No deformities, masses, or tenderness noted. Lungs:  Normal respiratory effort, chest expands symmetrically. Lungs are clear to auscultation, no crackles or wheezes. Heart:  Normal rate and regular rhythm. S1 and S2 normal without gallop, murmur, click, rub or other extra sounds.   Impression & Recommendations:  Problem # 1:  GERD (ICD-530.81)  The following medications were removed from the medication list:    Nexium 40 Mg Cpdr (Esomeprazole magnesium) ..... Once daily His updated medication list for this problem includes:    Omeprazole 40 Mg Cpdr (Omeprazole) .[name] Kitchen... Take one tab by mouth qd  Complete Medication List: 1)  Crestor 10 Mg Tabs (Rosuvastatin calcium) .... Once daily 2)  Omeprazole 40 Mg Cpdr (Omeprazole) .... Take one tab by mouth qd  Patient Instructions: 1)  He will switch taking the nexium to lunch time, and he will avoid spicy food and alcohol late at night.  2)  Please schedule a follow-up appointment as needed .

## 2010-12-12 NOTE — Assessment & Plan Note (Signed)
Summary: indigestion?/mhf   Vital Signs:  Patient Profile:   35 Years Old Male Height:     68 inches Weight:      187 pounds O2 Sat:      97 % O2 treatment:    Room Air Temp:     98.5 degrees F oral Pulse rate:   79 / minute Pulse rhythm:   regular BP sitting:   118 / 76  (left arm) Cuff size:   large  Vitals Entered By: Alfred Levins, CMA (May 13, 2008 10:05 AM)                 Chief Complaint:  chest Pain, lt arm tingles sometimes, and sob.  History of Present Illness: One month of frequent heartburn and chest pains. These often get worse after eating. No SOB, but he can feel a pressure sensation across the chest or into the back. Sometimes food stops partway down when he swallows. No nausea. BM's are normal.    Current Allergies (reviewed today): No known allergies   Past Medical History:    Reviewed history from 10/06/2007 and no changes required:       Hyperlipidemia       Allergic rhinitis  Past Surgical History:    Reviewed history from 10/06/2007 and no changes required:       Bilateral inguinal hernia repair     Review of Systems      See HPI   Physical Exam  General:     Well-developed,well-nourished,in no acute distress; alert,appropriate and cooperative throughout examination Neck:     No deformities, masses, or tenderness noted. Chest Wall:     No deformities, masses, tenderness or gynecomastia noted. Lungs:     Normal respiratory effort, chest expands symmetrically. Lungs are clear to auscultation, no crackles or wheezes. Heart:     Normal rate and regular rhythm. S1 and S2 normal without gallop, murmur, click, rub or other extra sounds. EKG normal. Abdomen:     Bowel sounds positive,abdomen soft and non-tender without masses, organomegaly or hernias noted.    Impression & Recommendations:  Problem # 1:  CHEST PAIN (ICD-786.50)  Orders: EKG w/ Interpretation (93000)   Complete Medication List: 1)  Crestor 10 Mg Tabs  (Rosuvastatin calcium) .... Once daily 2)  Nexium 40 Mg Cpdr (Esomeprazole magnesium) .... Once daily   Patient Instructions: 1)  This is consistent with GERD. Try Nexium once daily . 2)  Please schedule a follow-up appointment as needed.   Prescriptions: NEXIUM 40 MG  CPDR (ESOMEPRAZOLE MAGNESIUM) once daily  #30 x 11   Entered and Authorized by:   Nelwyn Salisbury MD   Signed by:   Nelwyn Salisbury MD on 05/13/2008   Method used:   Electronically sent to ...       [id number]       [address]       [place], [place]  [address]       Ph: [phone] or [phone]       Fax: [phone]   RxID:   [id number]  ]

## 2010-12-12 NOTE — Progress Notes (Signed)
Summary: not feeling better  Phone Note Call from Patient Call back at Home Phone [phone]   Caller: pt vm triage Call For: Clent Ridges  Summary of Call[facility] [address]  Called 10 days ago about sinus infection.  He was told to use over the counter meds.  He still is congested and has sore throat.   Initial call taken by: Roselle Locus,  June 09, 2008 10:16 AM  Follow-up for Phone Call        wAS TOLD TO USE THE mUCNINEX d  and normal saline nasal wash.  This has helped some.  Added Advil cold & sinus & tea.  Suggested OTC Claritin depending on what's in the Advil cold & sinus.  OV declined.  Will call back early next week after trying all as needed. Follow-up by: Rudy Jew, RN,  June 09, 2008 11:02 AM

## 2010-12-14 NOTE — Assessment & Plan Note (Signed)
Summary: numbness/hearburn worse/dm   Vital Signs:  Patient profile:   35 year old male Weight:      216 pounds Temp:     99 degrees F oral Pulse rate:   82 / minute BP sitting:   124 / 84  Vitals Entered By: Lynann Beaver CMA AAMA (November 27, 2010 3:09 PM) CC: Pt is complaining of Omeprazole is giving out before the 24 hours is up.  Also, he complains of feeling numb when he lies on that side. Is Patient Diabetic? No Pain Assessment Patient in pain? no        History of Present Illness: here for follow up. He finds that once a day dosing with Omeprazole is not sufficient to keep him from getting GERD at night. he has changed his diet around and is eating much more healthily lately. Wants generic meds where possible.   Current Medications (verified): 1)  Crestor 10 Mg  Tabs (Rosuvastatin Calcium) .... Once Daily 2)  Omeprazole 40 Mg Cpdr (Omeprazole) .... Take One Tab By Mouth Qd  Allergies (verified): No Known Drug Allergies  Past History:  Past Medical History: Reviewed history from 04/21/2009 and no changes required. Hyperlipidemia Allergic rhinitis GERD  Review of Systems  The patient denies anorexia, fever, weight loss, weight gain, vision loss, decreased hearing, hoarseness, chest pain, syncope, dyspnea on exertion, peripheral edema, prolonged cough, headaches, hemoptysis, abdominal pain, melena, hematochezia, severe indigestion/heartburn, hematuria, incontinence, genital sores, muscle weakness, suspicious skin lesions, transient blindness, difficulty walking, depression, unusual weight change, abnormal bleeding, enlarged lymph nodes, angioedema, breast masses, and testicular masses.    Physical Exam  General:  Well-developed,well-nourished,in no acute distress; alert,appropriate and cooperative throughout examination Lungs:  Normal respiratory effort, chest expands symmetrically. Lungs are clear to auscultation, no crackles or wheezes. Heart:  Normal rate and  regular rhythm. S1 and S2 normal without gallop, murmur, click, rub or other extra sounds. Abdomen:  Bowel sounds positive,abdomen soft and non-tender without masses, organomegaly or hernias noted.   Impression & Recommendations:  Problem # 1:  GERD (ICD-530.81)  His updated medication list for this problem includes:    Omeprazole 40 Mg Cpdr (Omeprazole) .[name] Kitchen..[name] Kitchen Two times a day  Problem # 2:  HYPERLIPIDEMIA (ICD-272.4)  The following medications were removed from the medication list:    Crestor 10 Mg Tabs (Rosuvastatin calcium) ..... Once daily His updated medication list for this problem includes:    Lipitor 20 Mg Tabs (Atorvastatin calcium) ..... Once daily  Complete Medication List: 1)  Omeprazole 40 Mg Cpdr (Omeprazole) .... Two times a day 2)  Lipitor 20 Mg Tabs (Atorvastatin calcium) .... Once daily  Patient Instructions: 1)  Please schedule a follow-up appointment in 3 months .  Prescriptions: OMEPRAZOLE 40 MG CPDR (OMEPRAZOLE) two times a day  #60 x 11   Entered and Authorized by:   Nelwyn Salisbury MD   Signed by:   Nelwyn Salisbury MD on 11/27/2010   Method used:   [facility]ronically to        [facility]  [address] [address] [phone] (retail)       [address]       [place], [place]  [address]       Ph: [phone] or [phone]       Fax: [phone]   RxID:   [id number] LIPITOR 20 MG TABS (ATORVASTATIN CALCIUM) once daily  #30 x 11   Entered and Authorized by:   Jeannett Senior  Marguerita Beards MD   Signed by:   Nelwyn Salisbury MD on 11/27/2010   Method used:   [facility]ically to        [facility]  [address] [phone][facility] (retail)       [address]       [place], [place]  [address]       Ph: [phone] or [phone]       Fax: [phone]   RxID:   [id number]    Orders Added: 1)  Est. Patient Level IV [03474]

## 2011-06-01 IMAGING — US US SCROTUM
1 series · 8 of 8 positions shown · non-contrast
Comparison: None.

CLINICAL DATA: Palpable abnormality on the left testicle

ULTRASOUND OF SCROTUM
TECHNIQUE: Complete ultrasound examination of the testicles,
epididymis, and other scrotal structures was performed.

[Series 1: us scrotum · 0.07mm/px · 8 of 8 slices shown]
[im 1/8]
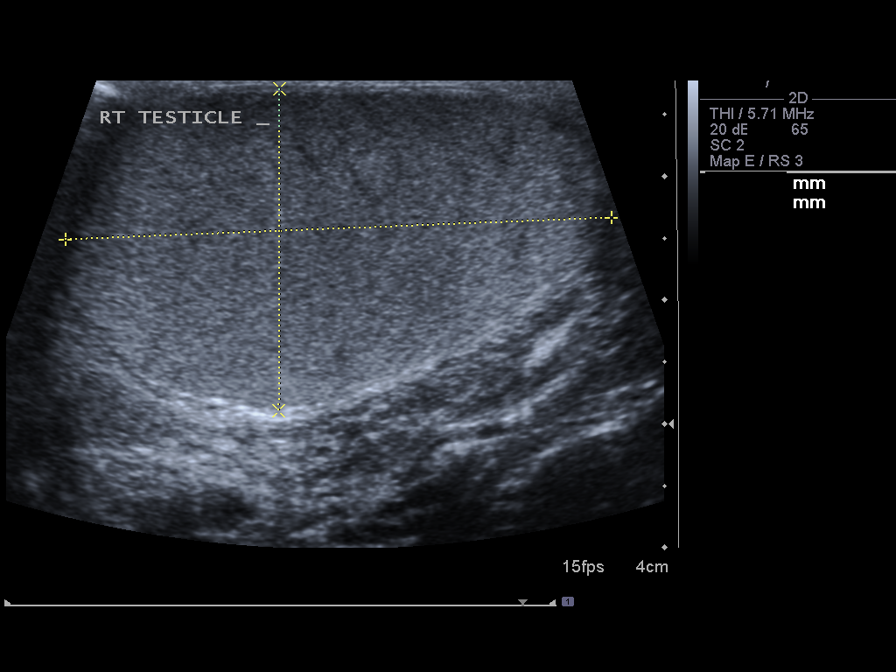
[im 2/8]
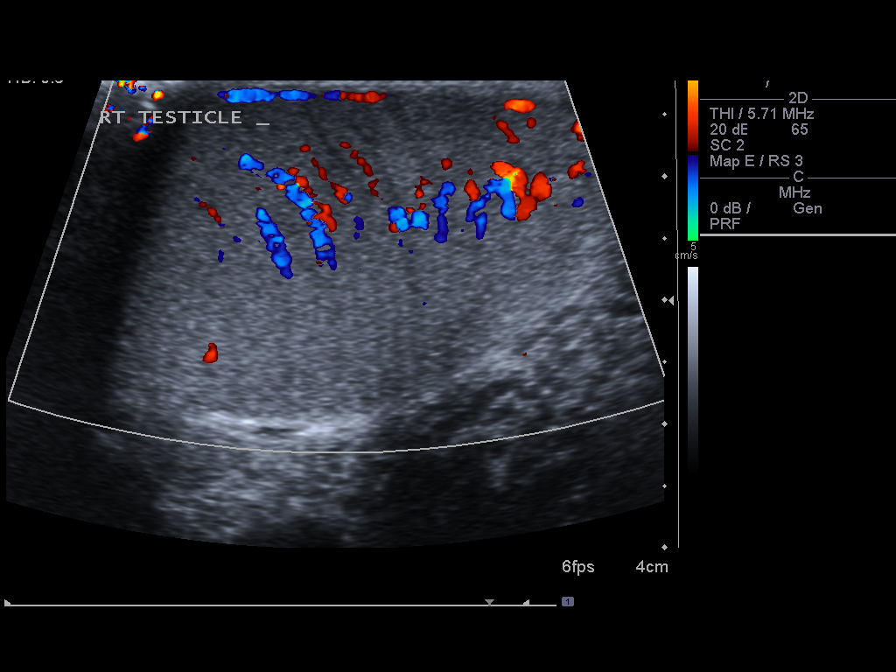
[im 3/8]
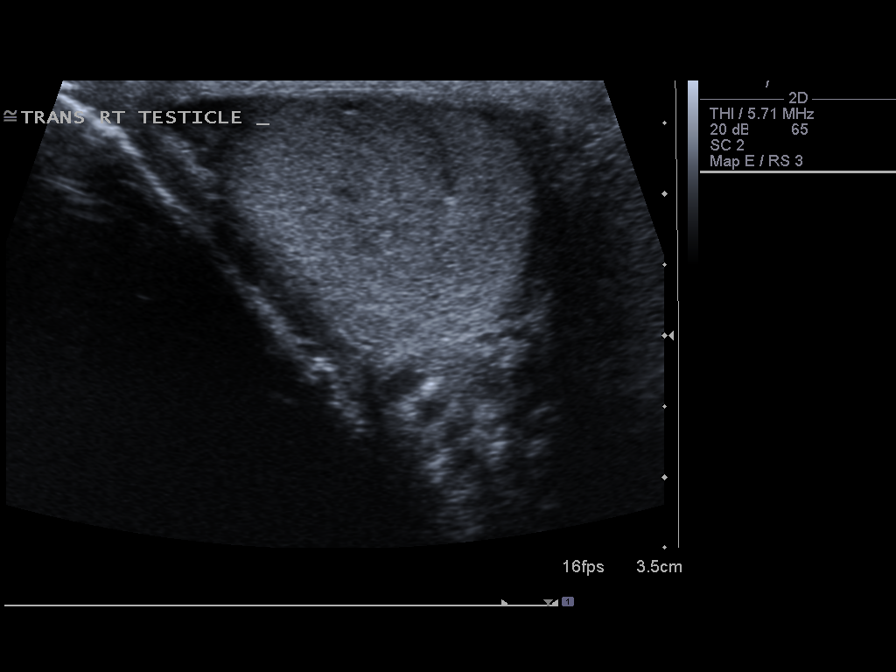
[im 4/8]
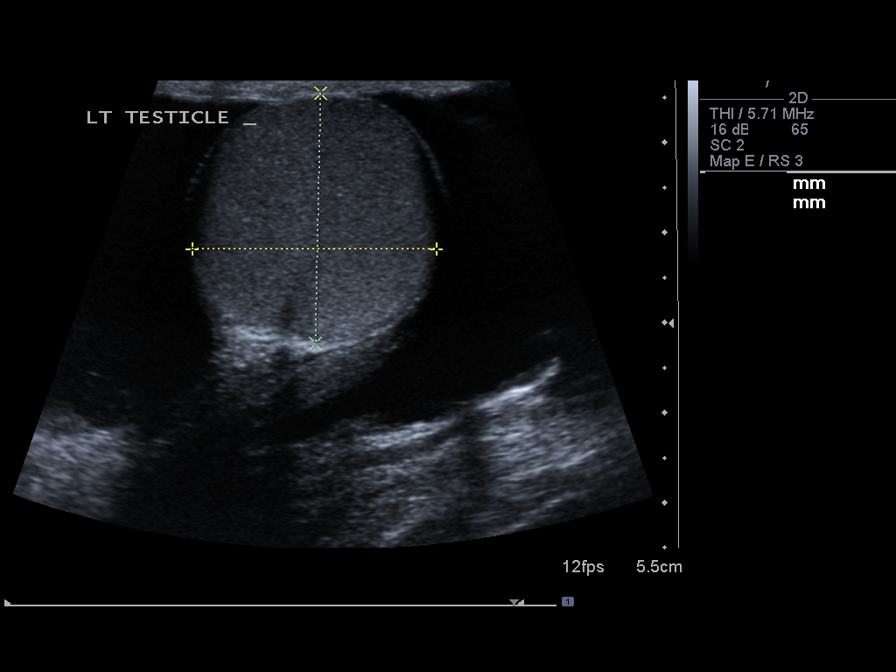
[im 5/8]
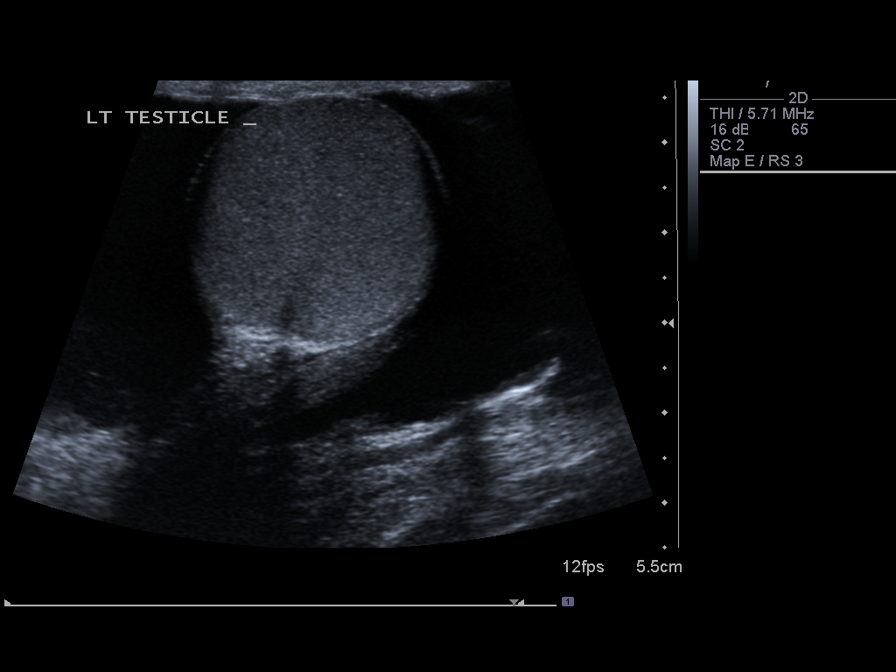
[im 6/8]
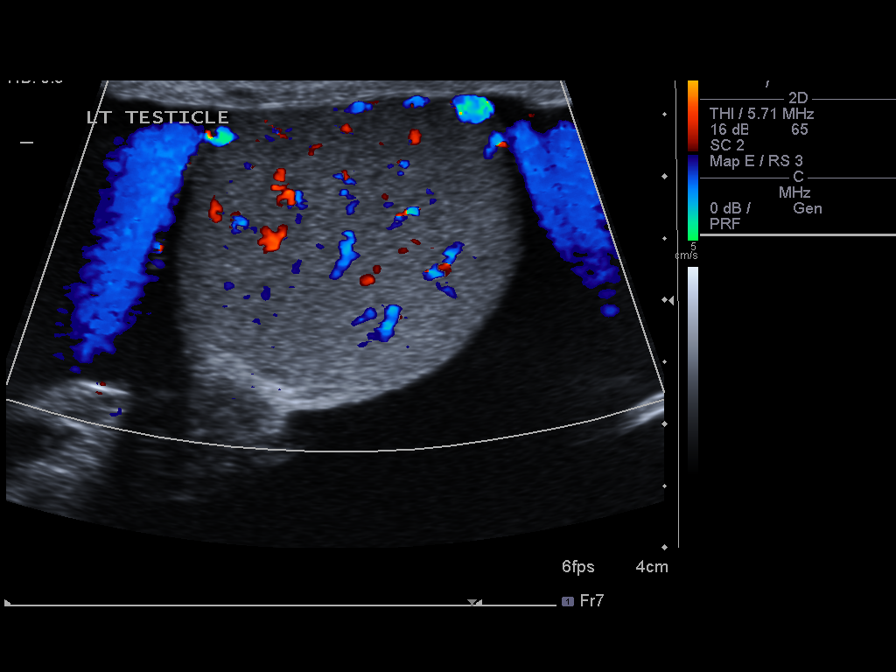
[im 7/8]
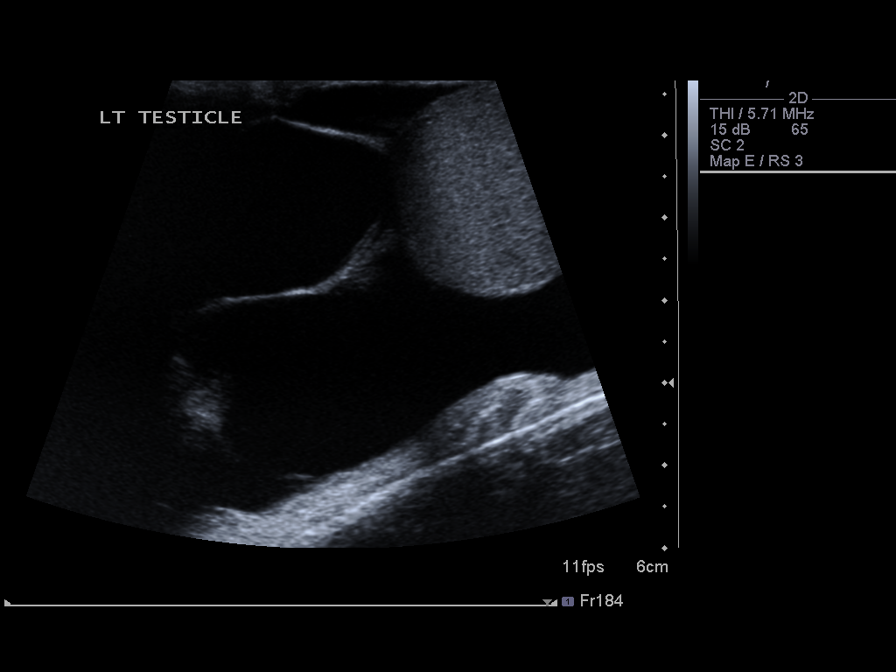
[im 8/8]
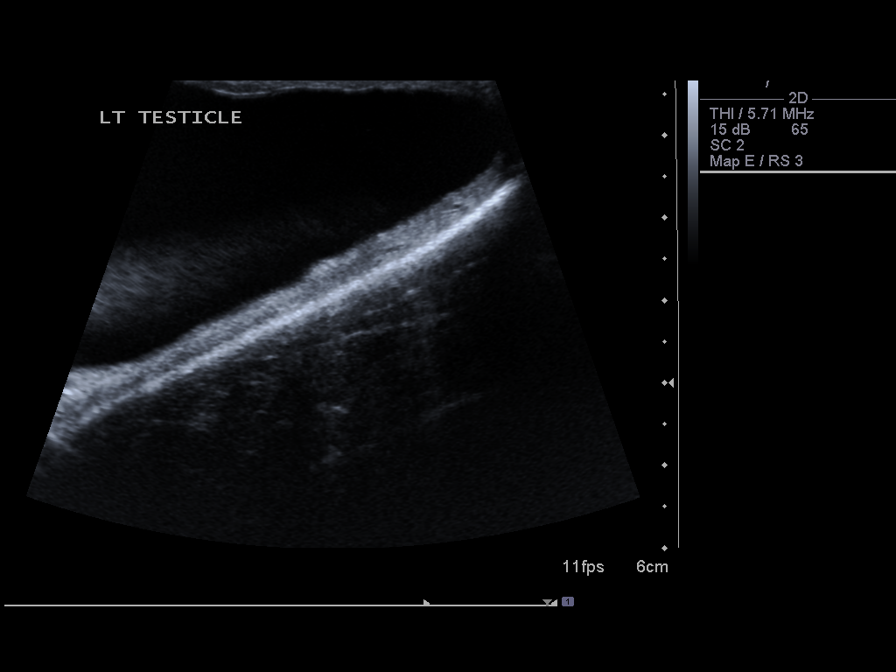

[8 of 8 positions shown; findings below may reference images not displayed]

FINDINGS: The testicles are normal in size and in echogenicity.
There is blood flow to both testicles.  No intratesticular
abnormality is seen.  The right epididymis appears normal.  The
left epididymis is not well seen.  There is a large left hydrocele
present of 10.0 x 3.8 x 6.9 cm with some septations.  No varicocele
is seen.  Only a tiny right hydrocele is noted.
IMPRESSION: 1.  Large left hydrocele with septations.
2.  No intratesticular abnormality.  Blood flow is noted to both
testicles.

## 2011-07-12 ENCOUNTER — Other Ambulatory Visit (INDEPENDENT_AMBULATORY_CARE_PROVIDER_SITE_OTHER): Payer: 59

## 2011-07-12 DIAGNOSIS — Z0000 Encounter for general adult medical examination without abnormal findings: Secondary | ICD-10-CM

## 2011-07-12 LAB — LIPID PANEL
Cholesterol: 139 mg/dL (ref 0–200)
HDL: 39.2 mg/dL (ref >39.00)
LDL Cholesterol: 89 mg/dL (ref 0–99)
Total CHOL/HDL Ratio: 4
Triglycerides: 56 mg/dL (ref 0.0–149.0)
VLDL: 11.2 mg/dL (ref 0.0–40.0)

## 2011-07-12 LAB — POCT URINALYSIS DIPSTICK
Bilirubin, UA: NEGATIVE
Blood, UA: NEGATIVE
Glucose, UA: NEGATIVE
Ketones, UA: NEGATIVE
Leukocytes, UA: NEGATIVE
Nitrite, UA: NEGATIVE
Protein, UA: NEGATIVE
Spec Grav, UA: 1.025
Urobilinogen, UA: 0.2
pH, UA: 5.5

## 2011-07-12 LAB — HEPATIC FUNCTION PANEL
ALT: 31 U/L (ref 0–53)
AST: 23 U/L (ref 0–37)
Albumin: 4.6 g/dL (ref 3.5–5.2)
Alkaline Phosphatase: 35 U/L — ABNORMAL LOW (ref 39–117)
Bilirubin, Direct: 0 mg/dL (ref 0.0–0.3)
Total Bilirubin: 0.7 mg/dL (ref 0.3–1.2)
Total Protein: 7.4 g/dL (ref 6.0–8.3)

## 2011-07-12 LAB — BASIC METABOLIC PANEL
BUN: 12 mg/dL (ref 6–23)
CO2: 29 mEq/L (ref 19–32)
Calcium: 9.3 mg/dL (ref 8.4–10.5)
Chloride: 105 mEq/L (ref 96–112)
Creatinine, Ser: 1.4 mg/dL (ref 0.4–1.5)
GFR: 78.39 mL/min (ref >60.00)
Glucose, Bld: 98 mg/dL (ref 70–99)
Potassium: 4.1 mEq/L (ref 3.5–5.1)
Sodium: 141 mEq/L (ref 135–145)

## 2011-07-12 LAB — CBC WITH DIFFERENTIAL/PLATELET
Basophils Absolute: 0 10*3/uL (ref 0.0–0.1)
Basophils Relative: 0.4 % (ref 0.0–3.0)
Eosinophils Absolute: 0.2 10*3/uL (ref 0.0–0.7)
Eosinophils Relative: 3.7 % (ref 0.0–5.0)
HCT: 45.5 % (ref 39.0–52.0)
Hemoglobin: 14.5 g/dL (ref 13.0–17.0)
Lymphocytes Relative: 27.4 % (ref 12.0–46.0)
Lymphs Abs: 1.7 10*3/uL (ref 0.7–4.0)
MCHC: 32 g/dL (ref 30.0–36.0)
MCV: 89.6 fl (ref 78.0–100.0)
Monocytes Absolute: 0.4 10*3/uL (ref 0.1–1.0)
Monocytes Relative: 6.5 % (ref 3.0–12.0)
Neutro Abs: 4 10*3/uL (ref 1.4–7.7)
Neutrophils Relative %: 62 % (ref 43.0–77.0)
Platelets: 218 10*3/uL (ref 150.0–400.0)
RBC: 5.07 Mil/uL (ref 4.22–5.81)
RDW: 13.4 % (ref 11.5–14.6)
WBC: 6.4 10*3/uL (ref 4.5–10.5)

## 2011-07-12 LAB — TSH: TSH: 1.41 u[IU]/mL (ref 0.35–5.50)

## 2011-07-13 ENCOUNTER — Encounter: Payer: Self-pay | Admitting: Family Medicine

## 2011-07-19 ENCOUNTER — Encounter: Payer: Self-pay | Admitting: Family Medicine

## 2011-07-19 ENCOUNTER — Ambulatory Visit (INDEPENDENT_AMBULATORY_CARE_PROVIDER_SITE_OTHER): Payer: 59 | Admitting: Family Medicine

## 2011-07-19 VITALS — BP 130/90 | HR 92 | Temp 98.5°F | Ht 66.75 in | Wt 226.0 lb

## 2011-07-19 DIAGNOSIS — Z23 Encounter for immunization: Secondary | ICD-10-CM

## 2011-07-19 DIAGNOSIS — L729 Follicular cyst of the skin and subcutaneous tissue, unspecified: Secondary | ICD-10-CM

## 2011-07-19 DIAGNOSIS — Z0000 Encounter for general adult medical examination without abnormal findings: Secondary | ICD-10-CM

## 2011-07-19 DIAGNOSIS — L723 Sebaceous cyst: Secondary | ICD-10-CM

## 2011-07-19 MED ORDER — ATORVASTATIN CALCIUM 20 MG PO TABS: 20.0000 mg | ORAL_TABLET | Freq: Every day | ORAL | Status: DC

## 2011-07-19 NOTE — Progress Notes (Signed)
  Subjective:    Patient ID: Brent Watson, male    DOB: 1976-08-24, 35 y.o.   MRN: [id number]  HPI 35 yr old male for a cpx. He feels well and has no complaints. He watches his diet but admits to getting very little exercise. He has put on 20 lbs of weight in the last year. We have followed him for a large scrotal hydrocele for years, and he is now considering having this treated.    Review of Systems  Constitutional: Negative.   HENT: Negative.   Eyes: Negative.   Respiratory: Negative.   Cardiovascular: Negative.   Gastrointestinal: Negative.   Genitourinary: Negative.   Musculoskeletal: Negative.   Skin: Negative.   Neurological: Negative.   Hematological: Negative.   Psychiatric/Behavioral: Negative.        Objective:   Physical Exam  Constitutional: He is oriented to person, place, and time. He appears well-developed and well-nourished. No distress.  HENT:  Head: Normocephalic and atraumatic.  Right Ear: External ear normal.  Left Ear: External ear normal.  Nose: Nose normal.  Mouth/Throat: Oropharynx is clear and moist. No oropharyngeal exudate.  Eyes: Conjunctivae and EOM are normal. Pupils are equal, round, and reactive to light. Right eye exhibits no discharge. Left eye exhibits no discharge. No scleral icterus.  Neck: Neck supple. No JVD present. No tracheal deviation present. No thyromegaly present.  Cardiovascular: Normal rate, regular rhythm, normal heart sounds and intact distal pulses.  Exam reveals no gallop and no friction rub.   No murmur heard. Pulmonary/Chest: Effort normal and breath sounds normal. No respiratory distress. He has no wheezes. He has no rales. He exhibits no tenderness.  Abdominal: Soft. Bowel sounds are normal. He exhibits no distension and no mass. There is no tenderness. There is no rebound and no guarding. Hernia confirmed negative in the right inguinal area and confirmed negative in the left inguinal area.  Genitourinary: Penis normal.  No penile tenderness.       The right testicle is normal. There is a very large hydrocele in the left scrotum which prevents me from palpating the left testicle at all    Musculoskeletal: Normal range of motion. He exhibits no edema and no tenderness.  Lymphadenopathy:    He has no cervical adenopathy.       Right: No inguinal adenopathy present.       Left: No inguinal adenopathy present.  Neurological: He is alert and oriented to person, place, and time. He has normal reflexes. No cranial nerve deficit. He exhibits normal muscle tone. Coordination normal.  Skin: Skin is warm and dry. No rash noted. He is not diaphoretic. No erythema. No pallor.  Psychiatric: He has a normal mood and affect. His behavior is normal. Judgment and thought content normal.          Assessment & Plan:  He needs to lose some weight, and he plans to start exercising again. His lipids are well controlled. His BP is borderline high, but I think losing weight will fix this. Will refer to Urology for the hydrocele

## 2012-01-12 ENCOUNTER — Other Ambulatory Visit: Payer: Self-pay | Admitting: Family Medicine

## 2012-04-11 ENCOUNTER — Telehealth: Payer: Self-pay

## 2012-04-11 NOTE — Telephone Encounter (Signed)
Called pt and sch for ov on Tuesday 04/15/12 at 10:45. Pt could not sch ov any sooner.

## 2012-04-11 NOTE — Telephone Encounter (Signed)
Patient states there is a "mark" (black) on his big toe left foot X 2 months. Patient states he thought it would go away and it isn't and there is a little pain when walking (sometimes). Patient would like to know if he should make an appt to see MD or see a foot specialist. Please advise?

## 2012-04-11 NOTE — Telephone Encounter (Signed)
See me about it. Make an OV

## 2012-04-11 NOTE — Telephone Encounter (Signed)
Can you call pt and offer a visit? 

## 2012-04-15 ENCOUNTER — Ambulatory Visit: Payer: 59 | Admitting: Family Medicine

## 2012-04-17 ENCOUNTER — Encounter: Payer: Self-pay | Admitting: Family Medicine

## 2012-04-17 ENCOUNTER — Ambulatory Visit (INDEPENDENT_AMBULATORY_CARE_PROVIDER_SITE_OTHER): Payer: 59 | Admitting: Family Medicine

## 2012-04-17 VITALS — BP 146/90 | HR 90 | Temp 98.2°F | Wt 220.0 lb

## 2012-04-17 DIAGNOSIS — B351 Tinea unguium: Secondary | ICD-10-CM

## 2012-04-17 NOTE — Progress Notes (Signed)
  Subjective:    Patient ID: [name] Kitchen, male    DOB: November 25, 1975, 36 y.o.   MRN: [id number]  HPI Here to check his left great toenail which developed a dark spot about 4 months ago. No hx of trauma. There is no discomfort. It seems to be slowly growing out.    Review of Systems  Constitutional: Negative.   Skin: Negative.        Objective:   Physical Exam  Constitutional: He appears well-developed and well-nourished.  Skin:       The left great toenail has a small dark well demarcated area at the distal lateral edge. There is some slight thickening of the nail.           Assessment & Plan:  This seems to be a small area of fungal involvement. Reassured him this is benign. We agreed to simply observe it for now. Recheck prn

## 2012-08-25 ENCOUNTER — Other Ambulatory Visit: Payer: Self-pay | Admitting: Family Medicine

## 2012-10-03 ENCOUNTER — Encounter: Payer: Self-pay | Admitting: Family Medicine

## 2012-10-03 ENCOUNTER — Ambulatory Visit (INDEPENDENT_AMBULATORY_CARE_PROVIDER_SITE_OTHER): Payer: 59 | Admitting: Family Medicine

## 2012-10-03 VITALS — BP 140/90 | HR 101 | Temp 98.8°F | Wt 218.0 lb

## 2012-10-03 DIAGNOSIS — S39011A Strain of muscle, fascia and tendon of abdomen, initial encounter: Secondary | ICD-10-CM

## 2012-10-03 DIAGNOSIS — IMO0002 Reserved for concepts with insufficient information to code with codable children: Secondary | ICD-10-CM

## 2012-10-03 LAB — POCT URINALYSIS DIPSTICK
Bilirubin, UA: NEGATIVE
Blood, UA: NEGATIVE
Glucose, UA: NEGATIVE
Ketones, UA: NEGATIVE
Leukocytes, UA: NEGATIVE
Nitrite, UA: NEGATIVE
Protein, UA: NEGATIVE
Spec Grav, UA: 1.015
Urobilinogen, UA: 0.2
pH, UA: 6.5

## 2012-10-03 NOTE — Addendum Note (Signed)
Addended by: Aniceto Boss A on: 10/03/2012 03:32 PM   Modules accepted: Orders

## 2012-10-03 NOTE — Progress Notes (Signed)
  Subjective:    Patient ID: [name] Kitchen, male    DOB: 07-22-1976, 36 y.o.   MRN: [id number]  HPI Here for 2 weeks of intermittent sharp RUQ pains that sometimes radiate through to the back. His appetite is normal. No nausea or fever. His BMs are normal. His urinations are normal. He notes that he has been working out with cross training at the gym, which involves some crunches.     Review of Systems  Constitutional: Negative.   Respiratory: Negative.   Cardiovascular: Negative.   Gastrointestinal: Positive for abdominal pain. Negative for nausea, vomiting, diarrhea, constipation, blood in stool and abdominal distention.  Genitourinary: Negative.        Objective:   Physical Exam  Constitutional: He appears well-developed and well-nourished. No distress.  Cardiovascular: Normal rate, regular rhythm, normal heart sounds and intact distal pulses.   Pulmonary/Chest: Effort normal and breath sounds normal. He has no wheezes.  Abdominal: Soft. Bowel sounds are normal. He exhibits no distension and no mass. There is no tenderness. There is no rebound and no guarding.          Assessment & Plan:  This is a muscular strain. Rest and let it heal.

## 2012-11-07 ENCOUNTER — Other Ambulatory Visit: Payer: Self-pay | Admitting: Family Medicine

## 2012-11-20 ENCOUNTER — Other Ambulatory Visit: Payer: 59

## 2012-11-27 ENCOUNTER — Encounter: Payer: 59 | Admitting: Family Medicine

## 2012-12-15 ENCOUNTER — Other Ambulatory Visit (INDEPENDENT_AMBULATORY_CARE_PROVIDER_SITE_OTHER): Payer: 59

## 2012-12-15 DIAGNOSIS — Z0000 Encounter for general adult medical examination without abnormal findings: Secondary | ICD-10-CM

## 2012-12-15 LAB — POCT URINALYSIS DIPSTICK
Bilirubin, UA: NEGATIVE
Blood, UA: NEGATIVE
Glucose, UA: NEGATIVE
Ketones, UA: NEGATIVE
Leukocytes, UA: NEGATIVE
Nitrite, UA: NEGATIVE
Protein, UA: NEGATIVE
Spec Grav, UA: >=1.03
Urobilinogen, UA: 0.2
pH, UA: 5.5

## 2012-12-15 LAB — CBC WITH DIFFERENTIAL/PLATELET
Basophils Absolute: 0 10*3/uL (ref 0.0–0.1)
Basophils Relative: 0.5 % (ref 0.0–3.0)
Eosinophils Absolute: 0.2 10*3/uL (ref 0.0–0.7)
Eosinophils Relative: 2.7 % (ref 0.0–5.0)
HCT: 45 % (ref 39.0–52.0)
Hemoglobin: 14.8 g/dL (ref 13.0–17.0)
Lymphocytes Relative: 26.4 % (ref 12.0–46.0)
Lymphs Abs: 1.7 10*3/uL (ref 0.7–4.0)
MCHC: 33 g/dL (ref 30.0–36.0)
MCV: 87.4 fl (ref 78.0–100.0)
Monocytes Absolute: 0.4 10*3/uL (ref 0.1–1.0)
Monocytes Relative: 6.8 % (ref 3.0–12.0)
Neutro Abs: 4 10*3/uL (ref 1.4–7.7)
Neutrophils Relative %: 63.6 % (ref 43.0–77.0)
Platelets: 259 10*3/uL (ref 150.0–400.0)
RBC: 5.15 Mil/uL (ref 4.22–5.81)
RDW: 14.3 % (ref 11.5–14.6)
WBC: 6.3 10*3/uL (ref 4.5–10.5)

## 2012-12-15 LAB — LIPID PANEL
Cholesterol: 152 mg/dL (ref 0–200)
HDL: 36.6 mg/dL — ABNORMAL LOW (ref >39.00)
LDL Cholesterol: 98 mg/dL (ref 0–99)
Total CHOL/HDL Ratio: 4
Triglycerides: 86 mg/dL (ref 0.0–149.0)
VLDL: 17.2 mg/dL (ref 0.0–40.0)

## 2012-12-15 LAB — BASIC METABOLIC PANEL
BUN: 16 mg/dL (ref 6–23)
CO2: 27 mEq/L (ref 19–32)
Calcium: 9.9 mg/dL (ref 8.4–10.5)
Chloride: 107 mEq/L (ref 96–112)
Creatinine, Ser: 1.5 mg/dL (ref 0.4–1.5)
GFR: 70.99 mL/min (ref >60.00)
Glucose, Bld: 102 mg/dL — ABNORMAL HIGH (ref 70–99)
Potassium: 4.4 mEq/L (ref 3.5–5.1)
Sodium: 142 mEq/L (ref 135–145)

## 2012-12-15 LAB — HEPATIC FUNCTION PANEL
ALT: 31 U/L (ref 0–53)
AST: 22 U/L (ref 0–37)
Albumin: 4.7 g/dL (ref 3.5–5.2)
Alkaline Phosphatase: 38 U/L — ABNORMAL LOW (ref 39–117)
Bilirubin, Direct: 0 mg/dL (ref 0.0–0.3)
Total Bilirubin: 0.9 mg/dL (ref 0.3–1.2)
Total Protein: 7.7 g/dL (ref 6.0–8.3)

## 2012-12-15 LAB — TSH: TSH: 1.1 u[IU]/mL (ref 0.35–5.50)

## 2012-12-19 NOTE — Progress Notes (Signed)
Quick Note:  Pt has CPE on 2/10 ______ 

## 2012-12-22 ENCOUNTER — Ambulatory Visit (INDEPENDENT_AMBULATORY_CARE_PROVIDER_SITE_OTHER): Payer: 59 | Admitting: Family Medicine

## 2012-12-22 ENCOUNTER — Encounter: Payer: Self-pay | Admitting: Family Medicine

## 2012-12-22 VITALS — BP 140/90 | HR 99 | Temp 98.0°F | Ht 66.25 in | Wt 216.0 lb

## 2012-12-22 DIAGNOSIS — Z0000 Encounter for general adult medical examination without abnormal findings: Secondary | ICD-10-CM

## 2012-12-22 NOTE — Progress Notes (Signed)
  Subjective:    Patient ID: Brent Watson, male    DOB: October 12, 1976, 37 y.o.   MRN: [id number]  HPI 37 yr old male for a cpx. He feels well in general. He saw Dr. Brunilda Payor for a left hydrocele about 18 months ago and they tossed around the idea of removing this. Tao decided to wait since it did not not cause pain. This has gotten larger however. He is dieting and has lost 4 lbs in the past 2 months.    Review of Systems  Constitutional: Negative.   HENT: Negative.   Eyes: Negative.   Respiratory: Negative.   Cardiovascular: Negative.   Gastrointestinal: Negative.   Genitourinary: Negative.   Musculoskeletal: Negative.   Skin: Negative.   Neurological: Negative.   Psychiatric/Behavioral: Negative.        Objective:   Physical Exam  Constitutional: He is oriented to person, place, and time. He appears well-developed and well-nourished. No distress.  HENT:  Head: Normocephalic and atraumatic.  Right Ear: External ear normal.  Left Ear: External ear normal.  Nose: Nose normal.  Mouth/Throat: Oropharynx is clear and moist. No oropharyngeal exudate.  Eyes: Conjunctivae and EOM are normal. Pupils are equal, round, and reactive to light. Right eye exhibits no discharge. Left eye exhibits no discharge. No scleral icterus.  Neck: Neck supple. No JVD present. No tracheal deviation present. No thyromegaly present.  Cardiovascular: Normal rate, regular rhythm, normal heart sounds and intact distal pulses.  Exam reveals no gallop and no friction rub.   No murmur heard. Pulmonary/Chest: Effort normal and breath sounds normal. No respiratory distress. He has no wheezes. He has no rales. He exhibits no tenderness.  Abdominal: Soft. Bowel sounds are normal. He exhibits no distension and no mass. There is no tenderness. There is no rebound and no guarding.  Genitourinary: Rectum normal, prostate normal and penis normal. Guaiac negative stool. No penile tenderness.  Very large hydrocele on the  left, I cannot palpate the left testicle   Musculoskeletal: Normal range of motion. He exhibits no edema and no tenderness.  Lymphadenopathy:    He has no cervical adenopathy.  Neurological: He is alert and oriented to person, place, and time. He has normal reflexes. No cranial nerve deficit. He exhibits normal muscle tone. Coordination normal.  Skin: Skin is warm and dry. No rash noted. He is not diaphoretic. No erythema. No pallor.  Psychiatric: He has a normal mood and affect. His behavior is normal. Judgment and thought content normal.          Assessment & Plan:  Well exam. I urged him to have the hydrocelectomy, and he will see Dr. Brunilda Payor soon

## 2013-01-21 ENCOUNTER — Encounter: Payer: Self-pay | Admitting: Family Medicine

## 2013-01-21 ENCOUNTER — Ambulatory Visit (INDEPENDENT_AMBULATORY_CARE_PROVIDER_SITE_OTHER): Payer: 59 | Admitting: Family Medicine

## 2013-01-21 VITALS — BP 140/88 | HR 97 | Temp 97.9°F | Wt 220.0 lb

## 2013-01-21 DIAGNOSIS — J019 Acute sinusitis, unspecified: Secondary | ICD-10-CM

## 2013-01-21 MED ORDER — AZITHROMYCIN 250 MG PO TABS: ORAL_TABLET | ORAL | Status: DC

## 2013-01-21 NOTE — Progress Notes (Signed)
  Subjective:    Patient ID: [name] Kitchen, male    DOB: November 14, 1975, 37 y.o.   MRN: [id number]  HPI Here for 5 days of sinus pressure, PND, ST, and a dry cough. No fever   Review of Systems  HENT: Positive for congestion, postnasal drip and sinus pressure.   Eyes: Negative.   Respiratory: Positive for cough.        Objective:   Physical Exam  Constitutional: He appears well-developed and well-nourished.  HENT:  Right Ear: External ear normal.  Left Ear: External ear normal.  Nose: Nose normal.  Mouth/Throat: Oropharynx is clear and moist.  Eyes: Conjunctivae are normal.  Neck: No thyromegaly present.  Pulmonary/Chest: Effort normal and breath sounds normal.  Lymphadenopathy:    He has no cervical adenopathy.          Assessment & Plan:  Add Mucinex D. Out of work today

## 2013-09-17 ENCOUNTER — Other Ambulatory Visit: Payer: Self-pay | Admitting: Family Medicine

## 2013-10-21 ENCOUNTER — Other Ambulatory Visit: Payer: Self-pay | Admitting: Family Medicine

## 2013-12-24 ENCOUNTER — Other Ambulatory Visit: Payer: Self-pay | Admitting: Family Medicine

## 2014-06-05 ENCOUNTER — Other Ambulatory Visit: Payer: Self-pay | Admitting: Family Medicine

## 2014-06-14 ENCOUNTER — Ambulatory Visit (INDEPENDENT_AMBULATORY_CARE_PROVIDER_SITE_OTHER): Payer: 59 | Admitting: Family Medicine

## 2014-06-14 ENCOUNTER — Encounter: Payer: Self-pay | Admitting: Family Medicine

## 2014-06-14 VITALS — BP 120/74 | HR 70 | Temp 98.5°F | Ht 66.25 in | Wt 219.0 lb

## 2014-06-14 DIAGNOSIS — L02419 Cutaneous abscess of limb, unspecified: Secondary | ICD-10-CM

## 2014-06-14 DIAGNOSIS — L03119 Cellulitis of unspecified part of limb: Secondary | ICD-10-CM

## 2014-06-14 DIAGNOSIS — L03115 Cellulitis of right lower limb: Secondary | ICD-10-CM

## 2014-06-14 MED ORDER — DOXYCYCLINE HYCLATE 100 MG PO CAPS: 100.0000 mg | ORAL_CAPSULE | Freq: Two times a day (BID) | ORAL | Status: AC

## 2014-06-14 NOTE — Progress Notes (Signed)
   Subjective:    Patient ID: Brent Kitchenravis L Watson, male    DOB: 28-Mar-1976, 38 y.o.   MRN: [id number]  HPI Here for a reaction to a bee sting that occurred 2 days ago. While watching his son play football he felt a sting on the lateral right ankle. They did not see the insect. It swelled up quickly but he had not systemic reactions. Now the area has become red and warm and tender.    Review of Systems  Constitutional: Negative.   Skin: Positive for color change and wound.       Objective:   Physical Exam  Constitutional: He appears well-developed and well-nourished. No distress.  Skin:  The right lateral ankle and lower leg is red, warm, swollen, and tender           Assessment & Plan:  He has developed a mild cellulitis. Use ice packs and we will cover with Doxycycline.

## 2014-06-14 NOTE — Progress Notes (Signed)
Pre visit review using our clinic review tool, if applicable. No additional management support is needed unless otherwise documented below in the visit note. 

## 2014-07-21 ENCOUNTER — Other Ambulatory Visit (INDEPENDENT_AMBULATORY_CARE_PROVIDER_SITE_OTHER): Payer: 59

## 2014-07-21 DIAGNOSIS — Z0000 Encounter for general adult medical examination without abnormal findings: Secondary | ICD-10-CM

## 2014-07-21 LAB — CBC WITH DIFFERENTIAL/PLATELET
Basophils Absolute: 0 10*3/uL (ref 0.0–0.1)
Basophils Relative: 0.4 % (ref 0.0–3.0)
Eosinophils Absolute: 0.3 10*3/uL (ref 0.0–0.7)
Eosinophils Relative: 5.2 % — ABNORMAL HIGH (ref 0.0–5.0)
HCT: 44.5 % (ref 39.0–52.0)
Hemoglobin: 14.5 g/dL (ref 13.0–17.0)
Lymphocytes Relative: 27.3 % (ref 12.0–46.0)
Lymphs Abs: 1.6 10*3/uL (ref 0.7–4.0)
MCHC: 32.5 g/dL (ref 30.0–36.0)
MCV: 87.6 fl (ref 78.0–100.0)
Monocytes Absolute: 0.4 10*3/uL (ref 0.1–1.0)
Monocytes Relative: 7.4 % (ref 3.0–12.0)
Neutro Abs: 3.4 10*3/uL (ref 1.4–7.7)
Neutrophils Relative %: 59.7 % (ref 43.0–77.0)
Platelets: 239 10*3/uL (ref 150.0–400.0)
RBC: 5.08 Mil/uL (ref 4.22–5.81)
RDW: 13.4 % (ref 11.5–15.5)
WBC: 5.7 10*3/uL (ref 4.0–10.5)

## 2014-07-21 LAB — POCT URINALYSIS DIPSTICK
Bilirubin, UA: NEGATIVE
Blood, UA: NEGATIVE
Glucose, UA: NEGATIVE
Ketones, UA: NEGATIVE
Leukocytes, UA: NEGATIVE
Nitrite, UA: NEGATIVE
Protein, UA: NEGATIVE
Spec Grav, UA: 1.015
Urobilinogen, UA: 0.2
pH, UA: 7

## 2014-07-21 LAB — LIPID PANEL
Cholesterol: 190 mg/dL (ref 0–200)
HDL: 36.4 mg/dL — ABNORMAL LOW (ref >39.00)
LDL Cholesterol: 130 mg/dL — ABNORMAL HIGH (ref 0–99)
NonHDL: 153.6
Total CHOL/HDL Ratio: 5
Triglycerides: 119 mg/dL (ref 0.0–149.0)
VLDL: 23.8 mg/dL (ref 0.0–40.0)

## 2014-07-21 LAB — HEPATIC FUNCTION PANEL
ALT: 20 U/L (ref 0–53)
AST: 19 U/L (ref 0–37)
Albumin: 4.2 g/dL (ref 3.5–5.2)
Alkaline Phosphatase: 38 U/L — ABNORMAL LOW (ref 39–117)
Bilirubin, Direct: 0.1 mg/dL (ref 0.0–0.3)
Total Bilirubin: 0.6 mg/dL (ref 0.2–1.2)
Total Protein: 7.2 g/dL (ref 6.0–8.3)

## 2014-07-21 LAB — BASIC METABOLIC PANEL
BUN: 15 mg/dL (ref 6–23)
CO2: 29 mEq/L (ref 19–32)
Calcium: 9.5 mg/dL (ref 8.4–10.5)
Chloride: 104 mEq/L (ref 96–112)
Creatinine, Ser: 1.3 mg/dL (ref 0.4–1.5)
GFR: 82.62 mL/min (ref >60.00)
Glucose, Bld: 97 mg/dL (ref 70–99)
Potassium: 4.2 mEq/L (ref 3.5–5.1)
Sodium: 139 mEq/L (ref 135–145)

## 2014-07-21 LAB — TSH: TSH: 1.53 u[IU]/mL (ref 0.35–4.50)

## 2014-07-27 ENCOUNTER — Encounter: Payer: 59 | Admitting: Family Medicine

## 2014-09-03 ENCOUNTER — Encounter: Payer: 59 | Admitting: Family Medicine

## 2014-09-07 ENCOUNTER — Encounter: Payer: Self-pay | Admitting: Family Medicine

## 2014-09-07 ENCOUNTER — Ambulatory Visit (INDEPENDENT_AMBULATORY_CARE_PROVIDER_SITE_OTHER): Payer: 59 | Admitting: Family Medicine

## 2014-09-07 VITALS — BP 124/80 | HR 85 | Temp 98.4°F | Ht 66.25 in | Wt 226.0 lb

## 2014-09-07 DIAGNOSIS — N508 Other specified disorders of male genital organs: Secondary | ICD-10-CM

## 2014-09-07 DIAGNOSIS — N5089 Other specified disorders of the male genital organs: Secondary | ICD-10-CM

## 2014-09-07 DIAGNOSIS — M25511 Pain in right shoulder: Secondary | ICD-10-CM

## 2014-09-07 DIAGNOSIS — Z0000 Encounter for general adult medical examination without abnormal findings: Secondary | ICD-10-CM

## 2014-09-07 MED ORDER — OMEPRAZOLE 40 MG PO CPDR: DELAYED_RELEASE_CAPSULE | ORAL | Status: DC

## 2014-09-07 MED ORDER — ATORVASTATIN CALCIUM 20 MG PO TABS
ORAL_TABLET | ORAL | Status: DC
Start: 2014-09-07 — End: 2015-09-27

## 2014-09-07 NOTE — Progress Notes (Signed)
   Subjective:    Patient ID: Brent Kitchenravis L Watson, male    DOB: 06-28-1976, 38 y.o.   MRN: [id number]  HPI 38 yr old male for a cpx. He has 2 things to discuss. First we diagnosed him with a large hydrocele some years ago and we referred him to see urology in 2012. However at that time he was busy with his job and he did not follow through. The mass is not painful to him but this makes it very difficult to walk or to run. He travels at lot for his job and he has even been detained by airport security several times because the large mass is seen on their Xray machines. He wants to have this removed now. Also he has had pain in the right shoulder for several years and he thinks he has injured the rotator cuff.    Review of Systems  Constitutional: Negative.   HENT: Negative.   Eyes: Negative.   Respiratory: Negative.   Cardiovascular: Negative.   Gastrointestinal: Negative.   Genitourinary: Positive for scrotal swelling. Negative for dysuria, urgency, frequency, hematuria, flank pain, decreased urine volume, discharge, penile swelling, enuresis, difficulty urinating, genital sores, penile pain and testicular pain.  Musculoskeletal: Negative.   Skin: Negative.   Neurological: Negative.   Psychiatric/Behavioral: Negative.        Objective:   Physical Exam  Constitutional: He is oriented to person, place, and time. He appears well-developed and well-nourished. No distress.  HENT:  Head: Normocephalic and atraumatic.  Right Ear: External ear normal.  Left Ear: External ear normal.  Nose: Nose normal.  Mouth/Throat: Oropharynx is clear and moist. No oropharyngeal exudate.  Eyes: Conjunctivae and EOM are normal. Pupils are equal, round, and reactive to light. Right eye exhibits no discharge. Left eye exhibits no discharge. No scleral icterus.  Neck: Neck supple. No JVD present. No tracheal deviation present. No thyromegaly present.  Cardiovascular: Normal rate, regular rhythm, normal heart  sounds and intact distal pulses.  Exam reveals no gallop and no friction rub.   No murmur heard. Pulmonary/Chest: Effort normal and breath sounds normal. No respiratory distress. He has no wheezes. He has no rales. He exhibits no tenderness.  Abdominal: Soft. Bowel sounds are normal. He exhibits no distension and no mass. There is no tenderness. There is no rebound and no guarding.  Genitourinary: Rectum normal, prostate normal and penis normal. Guaiac negative stool. No penile tenderness.  There  is a very large firm non-tender mass in the scrotum  Musculoskeletal: Normal range of motion. He exhibits no edema and no tenderness.  Lymphadenopathy:    He has no cervical adenopathy.  Neurological: He is alert and oriented to person, place, and time. He has normal reflexes. No cranial nerve deficit. He exhibits normal muscle tone. Coordination normal.  Skin: Skin is warm and dry. No rash noted. He is not diaphoretic. No erythema. No pallor.  Psychiatric: He has a normal mood and affect. His behavior is normal. Judgment and thought content normal.          Assessment & Plan:  Well exam. Refer back to Dr. Brunilda PayorNesi for the scrotal mass. Refer to Orthopedics for the shoulder pain.

## 2014-09-07 NOTE — Progress Notes (Signed)
Pre visit review using our clinic review tool, if applicable. No additional management support is needed unless otherwise documented below in the visit note. 

## 2014-09-10 ENCOUNTER — Ambulatory Visit: Payer: 59 | Admitting: Family Medicine

## 2014-09-15 ENCOUNTER — Ambulatory Visit: Payer: 59 | Admitting: Sports Medicine

## 2014-09-27 ENCOUNTER — Ambulatory Visit (INDEPENDENT_AMBULATORY_CARE_PROVIDER_SITE_OTHER): Payer: 59 | Admitting: Sports Medicine

## 2014-09-27 ENCOUNTER — Encounter: Payer: Self-pay | Admitting: Sports Medicine

## 2014-09-27 VITALS — BP 111/86 | HR 93 | Ht 66.0 in | Wt 220.0 lb

## 2014-09-27 DIAGNOSIS — M25511 Pain in right shoulder: Secondary | ICD-10-CM

## 2014-09-27 MED ORDER — METHYLPREDNISOLONE ACETATE 40 MG/ML IJ SUSP
40.0000 mg | Freq: Once | INTRAMUSCULAR | Status: AC
  Administered 2014-09-27: 40 mg via INTRA_ARTICULAR

## 2014-09-27 NOTE — Progress Notes (Signed)
   Subjective:    Patient ID: Brent Kitchenravis L Watson, male    DOB: 08/16/1976, 38 y.o.   MRN: [id number]  HPI: Pt is a 38yo right-hand-dominant male who presents to clinic for about 3 months of right shoulder pain of insidious onset. He denies specific injury. Around the time his pain started, he had started using a "perfect push-up machine" (small round hand-held machine with hand-bars that allows a push-up motion with rotation) and felt like one day when he went to the gym he couldn't do push-ups without pain in the front of his shoulder. Pain is present with active ROM such as picking up a laptop bag to place into the passenger seat of a car, throwing a ball with his son, or doing push-ups or lifting. Pt describes pain at night but not pain that wakes him up, and pain is also present in the morning. He is unable to lie on his right side and feels his mattress may be worn out -- he states he slept on a hotel mattress during a business trip recently that did not bother his shoulder. He has tried no medications for his pain but massage therapy did help some in the past several weeks. He has no neck pain, shooting pain, weakness / numbness, or swelling in his arms or hands.  EMR sections updated. PMH: HLD (on Lipitor), GERD (on Prilosec) Surgeries: hernia repair as a child Family Hx: brother with recent rotator cuff surgery but no other MSK / rheumatological disorders to pt's knowledge Social: nonsmoker, social EtOH use  Review of Systems: As above. Pt generally feels well with full 12-system ROS negative.     Objective:   Physical Exam BP 111/86 mmHg  Pulse 93  Ht 5\' 6"  (1.676 m)  Wt 220 lb (99.791 kg)  BMI 35.53 kg/m2 Gen: well-appearing adult male in NAD  MSK: bilateral shoulders and cervical spine normal to inspection without point tenderness to palpation  Passive and active ROM full without pain in all planes, bilaterally  Minimal discomfort with resisted external rotation of right  shoulder  Minimal discomfort with isolated resisted supraspinatus flexion at right shoulder  Negative empty can, Delsa Bern'Brien, Hawkins testing  Equivocal Neer impingement test (discomfort without frank pain)  Pain reproduced with axial loading and internal / external rotation of right shoulder using elbow for torque (similar to motion using push-up machine as above)  Left shoulder normal in comparison  Neurovascular: alert, oriented, sensation grossly intact  Strength 5/5 in bilateral upper extremities in all shoulder planes     Assessment & Plan:  37yo with right shoulder pain likely related to overuse at the gym, suspicious for labral injury - discussed various options, with pt opting for injection today and f/u in 3 weeks - provided handout and therabands for rotator cuff exercises and demonstrated with teach-back - if worse or no better in 3 weeks, consider imaging / further work-up   Right Shoulder GH Joint Injection Consent obtained and verified. Time-out conducted. Noted no overlying erythema, induration, or other signs of local infection. Skin prepped in a sterile fashion. Topical analgesic spray: Ethyl chloride. Joint: right shoulder, glenohumeral joint Needle: 25g 1.5" Completed without difficulty. Performed by Dr. Margaretha Sheffieldraper. Meds: Depo-Medrol 1 mL (40 mg) with lidocaine 3 mL Advised to call if fevers/chills, erythema, induration, drainage, or persistent bleeding.  Bobbye Mortonhristopher M Damek Ende, MD PGY-3, [facility] 09/27/2014, 4:37 PM

## 2014-10-15 ENCOUNTER — Telehealth: Payer: Self-pay | Admitting: Family Medicine

## 2014-10-15 NOTE — Telephone Encounter (Signed)
Pt states he has fluid in his left testicle and it pt has appt in Jan.  Pt get this resolved. 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 Pt has to fly again 12/16 and wants to know if there is a letter, a bracelet or something he can get to identify to the airport security

## 2014-10-18 ENCOUNTER — Ambulatory Visit (INDEPENDENT_AMBULATORY_CARE_PROVIDER_SITE_OTHER): Payer: 59 | Admitting: Sports Medicine

## 2014-10-18 ENCOUNTER — Encounter: Payer: Self-pay | Admitting: Sports Medicine

## 2014-10-18 VITALS — BP 141/94 | Ht 66.0 in | Wt 220.0 lb

## 2014-10-18 DIAGNOSIS — M25511 Pain in right shoulder: Secondary | ICD-10-CM

## 2014-10-18 NOTE — Progress Notes (Signed)
   Subjective:    Patient ID: Brent Kitchenravis L Watson, male    DOB: 1975/12/16, 38 y.o.   MRN: [id number]  HPI  Patient comes in today for follow-up on right shoulder pain. Overall he is improving. Still getting pain with certain motions but it is not affecting his activity much. He has returned to some light lifting in the gym without too much discomfort. He is doing his home exercises. Mild pain at night.    Review of Systems     Objective:   Physical Exam Well-developed, well-nourished. No acute distress.  Right shoulder: Full active and passive range of motion. Negative empty can, negative Hawkins. Negative O'Briens. Minimal pain with axial loading of the joint. Negative apprehension. Negative clunk. Rotator cuff strength is 5/5. Neurovascular intact distally.       Assessment & Plan:  Improving right shoulder pain likely secondary to degenerative labral injury  Given his overall improvement after his recent intra-articular cortisone injection I'm going to hold on any further workup or treatment for the time being. I did caution him about lifting heavy in the gym. I also want him to continue with his home exercise program. If symptoms persist or worsen he will notify me and we will reconsider the merits of further diagnostic imaging. Otherwise, follow-up as needed.

## 2014-10-19 NOTE — Telephone Encounter (Signed)
I wrote such a letter for him

## 2014-10-19 NOTE — Telephone Encounter (Signed)
Letter is ready for pick up and I spoke with pt.

## 2015-03-29 ENCOUNTER — Other Ambulatory Visit: Payer: Self-pay | Admitting: Urology

## 2015-04-29 ENCOUNTER — Encounter (HOSPITAL_BASED_OUTPATIENT_CLINIC_OR_DEPARTMENT_OTHER): Admission: RE | Payer: Self-pay | Source: Ambulatory Visit

## 2015-04-29 ENCOUNTER — Ambulatory Visit (HOSPITAL_BASED_OUTPATIENT_CLINIC_OR_DEPARTMENT_OTHER): Admission: RE | Admit: 2015-04-29 | Payer: Self-pay | Source: Ambulatory Visit | Admitting: Urology

## 2015-04-29 SURGERY — HYDROCELECTOMY
Anesthesia: General | Laterality: Left

## 2015-05-19 ENCOUNTER — Other Ambulatory Visit: Payer: Self-pay | Admitting: Urology

## 2015-06-07 ENCOUNTER — Encounter (HOSPITAL_BASED_OUTPATIENT_CLINIC_OR_DEPARTMENT_OTHER): Payer: Self-pay | Admitting: *Deleted

## 2015-06-07 NOTE — Progress Notes (Signed)
To [facility] at 0845-Hg on arrival-Instructed NPO after MN-will take omeprazole with small amt water in am.

## 2015-06-09 NOTE — H&P (Signed)
History of Present Illness [name] was seen 3 1/2 years ago for a left hydrocele. He was advised to have a hydrocelectomy. However he never called to schedule the procedure. The hydrocele has been increasing in size and is causing more discomfort. He also has issues going through security at the airport because of fluids in the scrotum.  He has 2 sons age 37 and 3 and his wife is pregnant expecting a daughter. He would like to have a vasectomy.      Past Medical History Problems  1. History of heartburn (Z87.898) 2. History of hypercholesterolemia (Z86.39)  Surgical History Problems  1. History of Hernia Repair  Current Meds 1. Lipitor 20 MG Oral Tablet;  Therapy: 16Jan2012 to Recorded 2. Omeprazole 40 MG Oral Capsule Delayed Release;  Therapy: 16Jan2012 to Recorded  Allergies Medication  1. No Known Drug Allergies  Family History Problems  1. Family history of Diabetes Mellitus : Father 2. Family history of Family Health Status - Mother's Age   48 3. Family history of Family Health Status Number Of Children   2 sons 4. Family history of Father Deceased At Age 65   Renal failure 5. Family history of Hypertension : Father 6. Family history of Leukemia 7. Family history of Nephrolithiasis 8. Family history of Renal Failure : Sister  Social History Problems    Alcohol Use   1-2 a wk   Caffeine Use   2 per wk   Father deceased   Marital History - Currently Married   Married   Mother alive and healthy   47   Never A Smoker   Number of children   2 sons   Occupation   Data processing manager  Review of Systems Genitourinary, constitutional, skin, eye, otolaryngeal, hematologic/lymphatic, cardiovascular, pulmonary, endocrine, musculoskeletal, gastrointestinal, neurological and psychiatric system(s) were reviewed and pertinent findings if present are noted and are otherwise negative.  Genitourinary: scrotal swelling.  ENT: sore throat and sinus  problems.    Physical Exam Constitutional: Well nourished and well developed . No acute distress.  ENT:. The ears and nose are normal in appearance.  Neck: The appearance of the neck is normal and no neck mass is present.  Pulmonary: No respiratory distress and normal respiratory rhythm and effort.  Cardiovascular: Heart rate and rhythm are normal . No peripheral edema.  Abdomen: The abdomen is soft and nontender. No masses are palpated. No CVA tenderness. No hernias are palpable. No hepatosplenomegaly noted.  Genitourinary: Examination of the penis demonstrates no discharge, no masses, no lesions and a normal meatus. The scrotum is without lesions. Examination of the left scrotum demostrates a hydrocele. The right vas deferens is is palpably normal. The right epididymis is palpably normal and non-tender. The left epididymis is palpably normal and non-tender. The right testis is non-tender and without masses. The left testis is nonpalpable, non-tender and without masses.  Lymphatics: The femoral and inguinal nodes are not enlarged or tender.  Skin: Normal skin turgor, no visible rash and no visible skin lesions.  Neuro/Psych:. Mood and affect are appropriate.    Results/Data Urine [Data Includes: Last 1 Day]   05May2016 COLOR YELLOW  APPEARANCE CLEAR  SPECIFIC GRAVITY <1.005  pH 5.5  GLUCOSE NEG mg/dL BILIRUBIN NEG  KETONE NEG mg/dL BLOOD NEG  PROTEIN NEG mg/dL UROBILINOGEN 0.2 mg/dL NITRITE NEG  LEUKOCYTE ESTERASE NEG   Assessment Assessed  1. Hydrocele, left (N43.3) 2. Encounter for general counseling and advice on contraceptive management (Z30.09)  Plan Health  Maintenance  1. UA With REFLEX; [Do Not Release]; Status:Resulted - Requires Verification;   Done:  05May2016 11:00AM  Left hydrocelectomy. Bilateral vasectomy. The procedures, risks, benefits were explained to the patient. The risks of hydrocelectomy include but are not limited to hemorrhage, hematoma, infection,  recurrence. The risks of vasectomy are hematoma, infection, recanalization. He understands and wishes to proceed.   Discussion/Summary The patient was thoroughly counseled regarding permanent sterilization with vasectomy. The nature of the procedure itself and the periprocedure instructions were explained. The risks/potential complications and alternative options were discussed with the patient. Specific risks of vasectomy including but not limited to bleeding, infection, failure of the procedure/recanalization, sperm granuloma formation, epididymitis, chronic testicular pain, damage to other scrotal structures, etc. were discussed. The patient was also counseled that two negative semen analyses were required after vasectomy before it would be appropriate to discontinue other forms of contraception.

## 2015-06-10 ENCOUNTER — Encounter (HOSPITAL_BASED_OUTPATIENT_CLINIC_OR_DEPARTMENT_OTHER): Payer: Self-pay

## 2015-06-10 ENCOUNTER — Ambulatory Visit (HOSPITAL_BASED_OUTPATIENT_CLINIC_OR_DEPARTMENT_OTHER): Payer: 59 | Admitting: Anesthesiology

## 2015-06-10 ENCOUNTER — Encounter (HOSPITAL_BASED_OUTPATIENT_CLINIC_OR_DEPARTMENT_OTHER)
Admission: RE | Disposition: A | Discharge status: Home / Self Care | Payer: Self-pay | Source: Ambulatory Visit | Attending: Urology

## 2015-06-10 ENCOUNTER — Ambulatory Visit (HOSPITAL_BASED_OUTPATIENT_CLINIC_OR_DEPARTMENT_OTHER)
Admission: RE | Admit: 2015-06-10 | Discharge: 2015-06-10 | Disposition: A | Discharge status: Home / Self Care | Payer: 59 | Source: Ambulatory Visit | Attending: Urology | Admitting: Urology

## 2015-06-10 DIAGNOSIS — E780 Pure hypercholesterolemia: Secondary | ICD-10-CM | POA: Insufficient documentation

## 2015-06-10 DIAGNOSIS — K219 Gastro-esophageal reflux disease without esophagitis: Secondary | ICD-10-CM | POA: Insufficient documentation

## 2015-06-10 DIAGNOSIS — Z302 Encounter for sterilization: Secondary | ICD-10-CM | POA: Insufficient documentation

## 2015-06-10 DIAGNOSIS — N433 Hydrocele, unspecified: Secondary | ICD-10-CM | POA: Diagnosis present

## 2015-06-10 DIAGNOSIS — Z79899 Other long term (current) drug therapy: Secondary | ICD-10-CM | POA: Diagnosis not present

## 2015-06-10 HISTORY — PX: HYDROCELE EXCISION: SHX482

## 2015-06-10 HISTORY — PX: VASECTOMY: SHX75

## 2015-06-10 LAB — POCT HEMOGLOBIN-HEMACUE: Hemoglobin: 14.6 g/dL (ref 13.0–17.0)

## 2015-06-10 SURGERY — HYDROCELECTOMY
Anesthesia: General | Site: Scrotum | Laterality: Left

## 2015-06-10 MED ORDER — FENTANYL CITRATE (PF) 100 MCG/2ML IJ SOLN
INTRAMUSCULAR | Status: DC | PRN
Start: 2015-06-10 — End: 2015-06-10
  Administered 2015-06-10: 25 ug via INTRAVENOUS
  Administered 2015-06-10 (×2): 50 ug via INTRAVENOUS
  Administered 2015-06-10 (×2): 25 ug via INTRAVENOUS
  Administered 2015-06-10: 50 ug via INTRAVENOUS
  Administered 2015-06-10 (×3): 25 ug via INTRAVENOUS

## 2015-06-10 MED ORDER — HYDROCODONE-ACETAMINOPHEN 5-325 MG PO TABS
1.0000 | ORAL_TABLET | Freq: Four times a day (QID) | ORAL | Status: DC | PRN
  Administered 2015-06-10: 1 via ORAL
  Filled 2015-06-10: qty 1

## 2015-06-10 MED ORDER — ONDANSETRON HCL 4 MG/2ML IJ SOLN
INTRAMUSCULAR | Status: DC | PRN
  Administered 2015-06-10: 4 mg via INTRAVENOUS

## 2015-06-10 MED ORDER — LACTATED RINGERS IV SOLN
INTRAVENOUS | Status: DC
  Filled 2015-06-10: qty 1000

## 2015-06-10 MED ORDER — MIDAZOLAM HCL 2 MG/2ML IJ SOLN
INTRAMUSCULAR | Status: AC
  Filled 2015-06-10: qty 2

## 2015-06-10 MED ORDER — KETOROLAC TROMETHAMINE 30 MG/ML IJ SOLN
INTRAMUSCULAR | Status: DC | PRN
  Administered 2015-06-10: 30 mg via INTRAVENOUS

## 2015-06-10 MED ORDER — CEFAZOLIN SODIUM-DEXTROSE 2-3 GM-% IV SOLR
2.0000 g | INTRAVENOUS | Status: AC
  Administered 2015-06-10: 2 g via INTRAVENOUS
  Filled 2015-06-10: qty 50

## 2015-06-10 MED ORDER — HYDROCODONE-ACETAMINOPHEN 5-325 MG PO TABS
ORAL_TABLET | ORAL | Status: AC
  Filled 2015-06-10: qty 1

## 2015-06-10 MED ORDER — BUPIVACAINE HCL (PF) 0.25 % IJ SOLN
INTRAMUSCULAR | Status: DC | PRN
  Administered 2015-06-10: 7 mL

## 2015-06-10 MED ORDER — FENTANYL CITRATE (PF) 100 MCG/2ML IJ SOLN
INTRAMUSCULAR | Status: AC
  Filled 2015-06-10: qty 4

## 2015-06-10 MED ORDER — HYDROCODONE-ACETAMINOPHEN 5-325 MG PO TABS: 1.0000 | ORAL_TABLET | Freq: Four times a day (QID) | ORAL | Status: DC | PRN

## 2015-06-10 MED ORDER — MEPERIDINE HCL 25 MG/ML IJ SOLN
6.2500 mg | INTRAMUSCULAR | Status: DC | PRN
  Filled 2015-06-10: qty 1

## 2015-06-10 MED ORDER — CEFAZOLIN SODIUM 1-5 GM-% IV SOLN
1.0000 g | INTRAVENOUS | Status: DC
  Filled 2015-06-10: qty 50

## 2015-06-10 MED ORDER — LACTATED RINGERS IV SOLN
INTRAVENOUS | Status: DC
  Administered 2015-06-10 (×2): via INTRAVENOUS
  Filled 2015-06-10: qty 1000

## 2015-06-10 MED ORDER — DEXAMETHASONE SODIUM PHOSPHATE 4 MG/ML IJ SOLN
INTRAMUSCULAR | Status: DC | PRN
  Administered 2015-06-10: 10 mg via INTRAVENOUS

## 2015-06-10 MED ORDER — MIDAZOLAM HCL 5 MG/5ML IJ SOLN
INTRAMUSCULAR | Status: DC | PRN
  Administered 2015-06-10: 2 mg via INTRAVENOUS

## 2015-06-10 MED ORDER — CEFAZOLIN SODIUM-DEXTROSE 2-3 GM-% IV SOLR
INTRAVENOUS | Status: AC
  Filled 2015-06-10: qty 50

## 2015-06-10 MED ORDER — PROPOFOL 10 MG/ML IV BOLUS
INTRAVENOUS | Status: DC | PRN
  Administered 2015-06-10 (×3): 20 mg via INTRAVENOUS
  Administered 2015-06-10: 40 mg via INTRAVENOUS
  Administered 2015-06-10: 200 mg via INTRAVENOUS

## 2015-06-10 MED ORDER — GLYCOPYRROLATE 0.2 MG/ML IJ SOLN
INTRAMUSCULAR | Status: DC | PRN
  Administered 2015-06-10: 0.2 mg via INTRAVENOUS

## 2015-06-10 MED ORDER — PROMETHAZINE HCL 25 MG/ML IJ SOLN
6.2500 mg | INTRAMUSCULAR | Status: DC | PRN
  Filled 2015-06-10: qty 1

## 2015-06-10 MED ORDER — FENTANYL CITRATE (PF) 100 MCG/2ML IJ SOLN
25.0000 ug | INTRAMUSCULAR | Status: DC | PRN
  Filled 2015-06-10: qty 1

## 2015-06-10 MED ORDER — FENTANYL CITRATE (PF) 100 MCG/2ML IJ SOLN
INTRAMUSCULAR | Status: AC
  Filled 2015-06-10: qty 2

## 2015-06-10 MED ORDER — LIDOCAINE HCL (CARDIAC) 20 MG/ML IV SOLN
INTRAVENOUS | Status: DC | PRN
  Administered 2015-06-10: 100 mg via INTRAVENOUS

## 2015-06-10 MED ORDER — ACETAMINOPHEN 10 MG/ML IV SOLN
INTRAVENOUS | Status: DC | PRN
  Administered 2015-06-10: 1000 mg via INTRAVENOUS

## 2015-06-10 SURGICAL SUPPLY — 35 items
BLADE CLIPPER SURG (BLADE) ×3 IMPLANT
BLADE SURG 15 STRL LF DISP TIS (BLADE) ×2 IMPLANT
BLADE SURG 15 STRL SS (BLADE) ×3
BNDG GAUZE ELAST 4 BULKY (GAUZE/BANDAGES/DRESSINGS) ×3 IMPLANT
CANISTER SUCTION 2500CC (MISCELLANEOUS) ×1 IMPLANT
CLEANER CAUTERY TIP 5X5 PAD (MISCELLANEOUS) IMPLANT
CLOTH BEACON ORANGE TIMEOUT ST (SAFETY) ×3 IMPLANT
COVER BACK TABLE 60X90IN (DRAPES) ×3 IMPLANT
COVER MAYO STAND STRL (DRAPES) ×3 IMPLANT
DRAPE PED LAPAROTOMY (DRAPES) ×3 IMPLANT
ELECT NDL TIP 2.8 STRL (NEEDLE) ×2 IMPLANT
ELECT NEEDLE TIP 2.8 STRL (NEEDLE) ×3 IMPLANT
ELECT REM PT RETURN 9FT ADLT (ELECTROSURGICAL) ×3
ELECTRODE REM PT RTRN 9FT ADLT (ELECTROSURGICAL) ×2 IMPLANT
GLOVE BIO SURGEON STRL SZ7 (GLOVE) ×3 IMPLANT
LIQUID BAND (GAUZE/BANDAGES/DRESSINGS) ×1 IMPLANT
NDL HYPO 25X1 1.5 SAFETY (NEEDLE) ×2 IMPLANT
NEEDLE HYPO 25X1 1.5 SAFETY (NEEDLE) ×3 IMPLANT
NS IRRIG 500ML POUR BTL (IV SOLUTION) ×3 IMPLANT
PACK BASIN DAY SURGERY FS (CUSTOM PROCEDURE TRAY) ×3 IMPLANT
PAD CLEANER CAUTERY TIP 5X5 (MISCELLANEOUS) ×1
PAD PREP 24X48 CUFFED NSTRL (MISCELLANEOUS) ×3 IMPLANT
PENCIL BUTTON HOLSTER BLD 10FT (ELECTRODE) ×3 IMPLANT
SPONGE GAUZE 4X4 12PLY STER LF (GAUZE/BANDAGES/DRESSINGS) ×1 IMPLANT
SUPPORT SCROTAL LG STRP (MISCELLANEOUS) ×3 IMPLANT
SUT CHROMIC 3 0 PS 2 (SUTURE) ×3 IMPLANT
SUT CHROMIC 3 0 SH 27 (SUTURE) ×5 IMPLANT
SUT VIC AB 3-0 SH 27 (SUTURE) ×9
SUT VIC AB 3-0 SH 27X BRD (SUTURE) ×2 IMPLANT
SUT VICRYL AB 2 0 TIE (SUTURE) ×2 IMPLANT
SUT VICRYL AB 2 0 TIES (SUTURE) ×3
SYR CONTROL 10ML LL (SYRINGE) ×1 IMPLANT
TOWEL OR 17X24 6PK STRL BLUE (TOWEL DISPOSABLE) ×4 IMPLANT
TRAY DSU PREP LF (CUSTOM PROCEDURE TRAY) ×3 IMPLANT
YANKAUER SUCT BULB TIP NO VENT (SUCTIONS) ×3 IMPLANT

## 2015-06-10 NOTE — Op Note (Signed)
Brent Watson is a 39 y.o.   06/10/2015  General  Preop diagnosis: Left hydrocele. Elective sterilization.  Postop diagnosis: Same  Procedure done: Left hydrocelectomy. Bilateral vasectomy   Surgeon: Wendie Simmer. Argil Mahl  Anesthesia: Gen.  Indication: Patient is a 39 years old male who is known to have had a left hydrocele for the past 3 and half years. The hydrocele has been increasing in size and he has been having some discomfort. He has 2 children and his wife is pregnant. He would like to have a vasectomy. He is here today for left hydrocelectomy and bilateral vasectomy.  Procedure: Patient was identified by his wrist band and proper timeout was taken.  Under general anesthesia patient was prepped and draped and placed in the supine position. The left scrotum was infiltrated with 0.25% Marcaine. A longitudinal incision was made on the scrotum. The incision was carried down to the tunica vaginalis which was then incised. About 1 L of clear fluid were drained out of the hydrocele sac. Hemostasis was done with electrocautery. The vas was then identified and secured with a vas clamp. The vas was then dissected from the vas sheath. The vas was then incised. A 1 cm segment of the vas was excised. The needle tip Bovie was then passed through the lumen of the vas and each end of the vas was fulgurated. Each end of the vas was then doubly ligated with #2-0 Vicryl. The distal end of the vas was covered with the vas sheath.  Using the Lord's technique the tunica vaginalis was then imbricated with #00 Vicryl. Hemostasis was completed with electrocautery. The wound was then irrigated with normal saline. The subcutaneous tissues were closed with #2-0 Vicryl. The skin was closed with #3-0 chromic.  A small incision was then made on the right scrotum. The vas was then dissected from the surrounding tissues with the fine tip vas dissector. The vas was then secured with a vas clamp. The vas was then incised and  a 1 cm segment of the vas was excised. A needle tip Bovie was passed through the lumen of each end of the vas and each end was fulgurated. H end of the vas was then doubly ligated with #2-0 Vicryl. The distal end of the vas was covered with the vas sheath. There was no evidence of bleeding at the end of the procedure. The skin incision was then closed with #3-0 Vicryl.  Sterile dressing was applied to the wounds.  The patient tolerated the procedure well and left the OR in satisfactory condition to postanesthesia care unit.  EBL: Minimal  Needles, sponges count: Correct

## 2015-06-10 NOTE — Transfer of Care (Signed)
  Last Vitals:  Filed Vitals:   06/10/15 0847  BP: 146/87  Pulse: 95  Temp: 36.3 C  Resp: 16    Immediate Anesthesia Transfer of Care Note  Patient: Brent Watson Kitchen  Procedure(s) Performed: Procedure(s) (LRB): LEFT HYDROCELECTOMY ADULT (Left) BILATERAL VASECTOMY (Bilateral)  Patient Location: PACU  Anesthesia Type: General  Level of Consciousness: awake, alert  and oriented  Airway & Oxygen Therapy: Patient Spontanous Breathing and Patient connected to face mask oxygen  Post-op Assessment: Report given to PACU RN and Post -op Vital signs reviewed and stable  Post vital signs: Reviewed and stable  Complications: No apparent anesthesia complications

## 2015-06-10 NOTE — Discharge Instructions (Signed)
HOME CARE INSTRUCTIONS FOR SCROTAL PROCEDURES ° °Wound Care & Hygiene: °You may apply an ice bag to the scrotum for the first 24 hours.  This may help decrease swelling and soreness.  You may have a dressing held in place by an athletic supporter.  You may remove the dressing in 24 hours and shower in 48 hours.  Continue to use the athletic supporter or tight briefs for at least a week. °Activity: °Rest today - not necessarily flat bed rest.  Just take it easy.  You should not do strenuous activities until your follow-up visit with your doctor.  You may resume light activity in 48 hours. ° °Return to Work: ° °Your doctor will advise you of this depending on the type of work you do ° °Diet: °Drink liquids or eat a light diet this evening.  You may resume a regular diet tomorrow. ° °General Expectations: °You may have a small amount of bleeding.  The scrotum may be swollen or bruised for about a week. ° °Call your Doctor if these occur: ° -persistent or heavy bleeding ° -temperature of 101 degrees or more ° -severe pain, not relieved by your pain medication ° °Return to Doctor's Office:  °Call to set up and appointment. ° °Patient Signature:  __________________________________________________ ° °Nurse's Signature:  __________________________________________________ ° °Post Anesthesia Home Care Instructions ° °Activity: °Get plenty of rest for the remainder of the day. A responsible adult should stay with you for 24 hours following the procedure.  °For the next 24 hours, DO NOT: °-Drive a car °-Operate machinery °-Drink alcoholic beverages °-Take any medication unless instructed by your physician °-Make any legal decisions or sign important papers. ° °Meals: °Start with liquid foods such as gelatin or soup. Progress to regular foods as tolerated. Avoid greasy, spicy, heavy foods. If nausea and/or vomiting occur, drink only clear liquids until the nausea and/or vomiting subsides. Call your physician if vomiting  continues. ° °Special Instructions/Symptoms: °Your throat may feel dry or sore from the anesthesia or the breathing tube placed in your throat during surgery. If this causes discomfort, gargle with warm salt water. The discomfort should disappear within 24 hours. ° °If you had a scopolamine patch placed behind your ear for the management of post- operative nausea and/or vomiting: ° °1. The medication in the patch is effective for 72 hours, after which it should be removed.  Wrap patch in a tissue and discard in the trash. Wash hands thoroughly with soap and water. °2. You may remove the patch earlier than 72 hours if you experience unpleasant side effects which may include dry mouth, dizziness or visual disturbances. °3. Avoid touching the patch. Wash your hands with soap and water after contact with the patch. °  ° °

## 2015-06-10 NOTE — Anesthesia Preprocedure Evaluation (Signed)
Anesthesia Evaluation  Patient identified by MRN, date of birth, ID band Patient awake    Reviewed: Allergy & Precautions, NPO status , Patient's Chart, lab work & pertinent test results  Airway Mallampati: II  TM Distance: >3 FB Neck ROM: Full    Dental no notable dental hx.    Pulmonary neg pulmonary ROS,  breath sounds clear to auscultation  Pulmonary exam normal       Cardiovascular negative cardio ROS Normal cardiovascular examRhythm:Regular Rate:Normal     Neuro/Psych negative neurological ROS  negative psych ROS   GI/Hepatic Neg liver ROS, GERD-  Medicated and Controlled,  Endo/Other  negative endocrine ROS  Renal/GU negative Renal ROS  negative genitourinary   Musculoskeletal negative musculoskeletal ROS (+)   Abdominal   Peds negative pediatric ROS (+)  Hematology negative hematology ROS (+)   Anesthesia Other Findings   Reproductive/Obstetrics negative OB ROS                             Anesthesia Physical Anesthesia Plan  ASA: II  Anesthesia Plan: General   Post-op Pain Management:    Induction: Intravenous  Airway Management Planned: LMA  Additional Equipment:   Intra-op Plan:   Post-operative Plan: Extubation in OR  Informed Consent: I have reviewed the patients History and Physical, chart, labs and discussed the procedure including the risks, benefits and alternatives for the proposed anesthesia with the patient or authorized representative who has indicated his/her understanding and acceptance.   Dental advisory given  Plan Discussed with: CRNA  Anesthesia Plan Comments:         Anesthesia Quick Evaluation  

## 2015-06-10 NOTE — Anesthesia Procedure Notes (Signed)
Procedure Name: LMA Insertion Date/Time: 06/10/2015 10:35 AM Performed by: Norva Pavlov Pre-anesthesia Checklist: Patient identified, Emergency Drugs available, Suction available and Patient being monitored Patient Re-evaluated:Patient Re-evaluated prior to inductionOxygen Delivery Method: Circle System Utilized Preoxygenation: Pre-oxygenation with 100% oxygen Intubation Type: IV induction Ventilation: Mask ventilation without difficulty LMA: LMA inserted LMA Size: 5.0 Number of attempts: 1 Airway Equipment and Method: bite block Placement Confirmation: positive ETCO2 Tube secured with: Tape Dental Injury: Teeth and Oropharynx as per pre-operative assessment

## 2015-06-11 NOTE — Anesthesia Postprocedure Evaluation (Signed)
  Anesthesia Post-op Note  Patient: Brent Watson  Procedure(s) Performed: Procedure(s) (LRB): LEFT HYDROCELECTOMY ADULT (Left) BILATERAL VASECTOMY (Bilateral)  Patient Location: PACU  Anesthesia Type: General  Level of Consciousness: awake and alert   Airway and Oxygen Therapy: Patient Spontanous Breathing  Post-op Pain: mild  Post-op Assessment: Post-op Vital signs reviewed, Patient's Cardiovascular Status Stable, Respiratory Function Stable, Patent Airway and No signs of Nausea or vomiting  Last Vitals:  Filed Vitals:   06/10/15 1345  BP: 113/75  Pulse: 102  Temp: 36.8 C  Resp: 16    Post-op Vital Signs: stable   Complications: No apparent anesthesia complications

## 2015-06-13 ENCOUNTER — Encounter (HOSPITAL_BASED_OUTPATIENT_CLINIC_OR_DEPARTMENT_OTHER): Payer: Self-pay | Admitting: Urology

## 2015-09-12 ENCOUNTER — Other Ambulatory Visit: Payer: 59

## 2015-09-14 ENCOUNTER — Other Ambulatory Visit: Payer: Self-pay | Admitting: Family Medicine

## 2015-09-22 ENCOUNTER — Other Ambulatory Visit (INDEPENDENT_AMBULATORY_CARE_PROVIDER_SITE_OTHER): Payer: 59

## 2015-09-22 DIAGNOSIS — Z0000 Encounter for general adult medical examination without abnormal findings: Secondary | ICD-10-CM | POA: Diagnosis not present

## 2015-09-22 LAB — POCT URINALYSIS DIPSTICK
Bilirubin, UA: NEGATIVE
Blood, UA: NEGATIVE
Glucose, UA: NEGATIVE
Ketones, UA: NEGATIVE
Leukocytes, UA: NEGATIVE
Nitrite, UA: NEGATIVE
Protein, UA: NEGATIVE
Spec Grav, UA: 1.02
Urobilinogen, UA: 0.2
pH, UA: 6

## 2015-09-22 LAB — CBC WITH DIFFERENTIAL/PLATELET
Basophils Absolute: 0 10*3/uL (ref 0.0–0.1)
Basophils Relative: 0.3 % (ref 0.0–3.0)
Eosinophils Absolute: 0.2 10*3/uL (ref 0.0–0.7)
Eosinophils Relative: 3.7 % (ref 0.0–5.0)
HCT: 46.2 % (ref 39.0–52.0)
Hemoglobin: 14.8 g/dL (ref 13.0–17.0)
Lymphocytes Relative: 25.7 % (ref 12.0–46.0)
Lymphs Abs: 1.7 10*3/uL (ref 0.7–4.0)
MCHC: 32 g/dL (ref 30.0–36.0)
MCV: 87.1 fl (ref 78.0–100.0)
Monocytes Absolute: 0.4 10*3/uL (ref 0.1–1.0)
Monocytes Relative: 5.7 % (ref 3.0–12.0)
Neutro Abs: 4.2 10*3/uL (ref 1.4–7.7)
Neutrophils Relative %: 64.6 % (ref 43.0–77.0)
Platelets: 250 10*3/uL (ref 150.0–400.0)
RBC: 5.31 Mil/uL (ref 4.22–5.81)
RDW: 14.1 % (ref 11.5–15.5)
WBC: 6.6 10*3/uL (ref 4.0–10.5)

## 2015-09-22 LAB — HEPATIC FUNCTION PANEL
ALT: 28 U/L (ref 0–53)
AST: 21 U/L (ref 0–37)
Albumin: 4.7 g/dL (ref 3.5–5.2)
Alkaline Phosphatase: 41 U/L (ref 39–117)
Bilirubin, Direct: 0.1 mg/dL (ref 0.0–0.3)
Total Bilirubin: 0.6 mg/dL (ref 0.2–1.2)
Total Protein: 7.2 g/dL (ref 6.0–8.3)

## 2015-09-22 LAB — BASIC METABOLIC PANEL
BUN: 15 mg/dL (ref 6–23)
CO2: 31 mEq/L (ref 19–32)
Calcium: 10 mg/dL (ref 8.4–10.5)
Chloride: 105 mEq/L (ref 96–112)
Creatinine, Ser: 1.29 mg/dL (ref 0.40–1.50)
GFR: 80.61 mL/min (ref >60.00)
Glucose, Bld: 100 mg/dL — ABNORMAL HIGH (ref 70–99)
Potassium: 4.5 mEq/L (ref 3.5–5.1)
Sodium: 141 mEq/L (ref 135–145)

## 2015-09-22 LAB — TSH: TSH: 1.05 u[IU]/mL (ref 0.35–4.50)

## 2015-09-22 LAB — LIPID PANEL
Cholesterol: 151 mg/dL (ref 0–200)
HDL: 34.9 mg/dL — ABNORMAL LOW (ref >39.00)
LDL Cholesterol: 103 mg/dL — ABNORMAL HIGH (ref 0–99)
NonHDL: 116.54
Total CHOL/HDL Ratio: 4
Triglycerides: 69 mg/dL (ref 0.0–149.0)
VLDL: 13.8 mg/dL (ref 0.0–40.0)

## 2015-09-27 ENCOUNTER — Ambulatory Visit (INDEPENDENT_AMBULATORY_CARE_PROVIDER_SITE_OTHER): Payer: 59 | Admitting: Family Medicine

## 2015-09-27 ENCOUNTER — Encounter: Payer: Self-pay | Admitting: Family Medicine

## 2015-09-27 VITALS — BP 117/72 | HR 88 | Temp 98.4°F | Ht 66.0 in | Wt 237.0 lb

## 2015-09-27 DIAGNOSIS — Z0000 Encounter for general adult medical examination without abnormal findings: Secondary | ICD-10-CM

## 2015-09-27 DIAGNOSIS — Z23 Encounter for immunization: Secondary | ICD-10-CM | POA: Diagnosis not present

## 2015-09-27 MED ORDER — ATORVASTATIN CALCIUM 20 MG PO TABS
ORAL_TABLET | ORAL | Status: DC
Start: 2015-09-27 — End: 2016-10-10

## 2015-09-27 NOTE — Progress Notes (Signed)
Pre visit review using our clinic review tool, if applicable. No additional management support is needed unless otherwise documented below in the visit note. 

## 2015-09-27 NOTE — Progress Notes (Signed)
   Subjective:    Patient ID: Brent Kitchenravis L Watson, male    DOB: 1976/05/30, 39 y.o.   MRN: [id number]  HPI 39 yr old male for a cpx. He feels well.    Review of Systems  Constitutional: Negative.   HENT: Negative.   Eyes: Negative.   Respiratory: Negative.   Cardiovascular: Negative.   Gastrointestinal: Negative.   Genitourinary: Negative.   Musculoskeletal: Negative.   Skin: Negative.   Neurological: Negative.   Psychiatric/Behavioral: Negative.        Objective:   Physical Exam  Constitutional: He is oriented to person, place, and time. He appears well-developed and well-nourished. No distress.  HENT:  Head: Normocephalic and atraumatic.  Right Ear: External ear normal.  Left Ear: External ear normal.  Nose: Nose normal.  Mouth/Throat: Oropharynx is clear and moist. No oropharyngeal exudate.  Eyes: Conjunctivae and EOM are normal. Pupils are equal, round, and reactive to light. Right eye exhibits no discharge. Left eye exhibits no discharge. No scleral icterus.  Neck: Neck supple. No JVD present. No tracheal deviation present. No thyromegaly present.  Cardiovascular: Normal rate, regular rhythm, normal heart sounds and intact distal pulses.  Exam reveals no gallop and no friction rub.   No murmur heard. Pulmonary/Chest: Effort normal and breath sounds normal. No respiratory distress. He has no wheezes. He has no rales. He exhibits no tenderness.  Abdominal: Soft. Bowel sounds are normal. He exhibits no distension and no mass. There is no tenderness. There is no rebound and no guarding.  Musculoskeletal: Normal range of motion. He exhibits no edema or tenderness.  Lymphadenopathy:    He has no cervical adenopathy.  Neurological: He is alert and oriented to person, place, and time. He has normal reflexes. No cranial nerve deficit. He exhibits normal muscle tone. Coordination normal.  Skin: Skin is warm and dry. No rash noted. He is not diaphoretic. No erythema. No pallor.    Psychiatric: He has a normal mood and affect. His behavior is normal. Judgment and thought content normal.          Assessment & Plan:  Well exam. We reviewed diet and exercise advice.

## 2015-11-30 ENCOUNTER — Telehealth: Payer: Self-pay | Admitting: Family Medicine

## 2015-11-30 NOTE — Telephone Encounter (Signed)
[facility] - Client TELEPHONE ADVICE RECORD   [facility]    --------------------------------------------------------------------------------   Patient Name: Brent Watson  Gender: Male  DOB: 09/02/1976   Age: 40 Y 1 M 28 D  Return Phone Number: [phone] (Primary)  Address:     City/State/Zip:  [place]     Client [facility] - Client  Client Site [facility]  Physician Gershon Crane   Contact Type Call  Call Type Triage / Clinical  Relationship To Patient Self  Appointment Disposition EMR Appointment Not Necessary  Info pasted into Epic Yes  Return Phone Number [phone] (Primary)  Chief Complaint Cold Symptom  Initial Comment Caller states that he has sinus issues; phlegm, cold symptoms  PreDisposition Home Care       Nurse Assessment  Nurse: Harlon Flor, RN, Darl Pikes Date/Time (Eastern Time): 11/30/2015 4:20:56 PM  Confirm and document reason for call. If symptomatic, describe symptoms. You must click the next button to save text entered. ---Caller states that he has sinus issues; phlegm, cold symptoms no fever does not have asthma He is at the gym now.    Has the patient traveled out of the country within the last 30 days? ---No    Does the patient have any new or worsening symptoms? ---Yes    Will a triage be completed? ---Yes    Related visit to physician within the last 2 weeks? ---No    Does the PT have any chronic conditions? (i.e. diabetes, asthma, etc.) ---Yes    List chronic conditions. ---cholesterol atorvastatin    Is this a behavioral health or substance abuse call? ---No           Guidelines          Guideline Title Affirmed Question Affirmed Notes Nurse Date/Time Lamount Cohen Time)  Common Cold Cold with no complications (all triage questions negative)    Whitaker, RN, Darl Pikes 11/30/2015 4:22:24 PM    Disp. Time Lamount Cohen Time) Disposition Final User          11/30/2015 4:26:39 PM Home Care Yes Harlon Flor, RN, Helane Rima Understands: Yes  Disagree/Comply: Comply       Care Advice Given Per Guideline        HOME CARE: You should be able to treat this at home. REASSURANCE: * It sounds like an uncomplicated cold that we can treat at home. * Colds are very common and may make you feel uncomfortable. FOR A STUFFY NOSE - USE NASAL WASHES: * Introduction: Saline (salt water) nasal irrigation (nasal wash) is an effective and simple home remedy for treating stuffy nose and sinus congestion. The nose can be irrigated by pouring, spraying, or squirting salt water into the nose and then letting it run back out. * How it Helps: The salt water rinses out excess mucus, washes out any irritants (dust, allergens) that might be present, and moistens the nasal cavity. * PSEUDOEPHEDRINE (Sudafed) is available OTC in pill form. Typical adult dosage is two 30 mg tablets every 6 hours. * Sore throat: throat lozenges, hard candy or warm chicken broth. TREATMENT FOR ASSOCIATED SYMPTOMS OF COLDS: * For muscle aches, headaches, or moderate fever (over 101 degrees F) (38.9 C) use acetaminophen every 4 hours. * Cough: use cough drops. * Hydrate: drink extra liquids. HUMIDIFIER: If the air in your home is dry, use a humidifier. EXPECTED COURSE: * Fever  2-3 days * Nasal discharge 7-14 days * Cough 2-3 weeks. CALL BACK IF: * Fever lasts over 3 days * You become short of breath * You become worse. CARE ADVICE given per Colds (Adult) guideline.    After Care Instructions Given        Call Event Type User Date / Time Description        --------------------------------------------------------------------------------            Referrals  REFERRED TO PCP OFFICE

## 2015-11-30 NOTE — Telephone Encounter (Signed)
FYI

## 2016-01-10 ENCOUNTER — Other Ambulatory Visit: Payer: Self-pay

## 2016-01-10 MED ORDER — OMEPRAZOLE 40 MG PO CPDR: DELAYED_RELEASE_CAPSULE | ORAL | Status: DC

## 2016-01-11 ENCOUNTER — Telehealth: Payer: Self-pay | Admitting: Family Medicine

## 2016-01-11 NOTE — Telephone Encounter (Signed)
[facility] - Client TELEPHONE ADVICE RECORD [facility]  Patient Name: Brent Watson  DOB: 21-Oct-1976    Initial Comment Caller states he just got over sinus congestion. Having some trouble with his breathing when going up steps.   Nurse Assessment  Nurse: Dorthula Rue, RN, Enrique Sack Date/Time (Eastern Time): 01/11/2016 11:58:43 AM  Confirm and document reason for call. If symptomatic, describe symptoms. You must click the next button to save text entered. ---Caller states he is feeling good, but feels short of breath when walking up steps. He is still working out at [employer] He is more winded than usual per caller. He has a slight stuffy nose.  Has the patient traveled out of the country within the last 30 days? ---Not Applicable  Does the patient have any new or worsening symptoms? ---Yes  Will a triage be completed? ---Yes  Related visit to physician within the last 2 weeks? ---No  Does the PT have any chronic conditions? (i.e. diabetes, asthma, etc.) ---No  Is this a behavioral health or substance abuse call? ---No     Guidelines    Guideline Title Affirmed Question Affirmed Notes  Breathing Difficulty [1] MODERATE longstanding difficulty breathing (e.g., speaks in phrases, SOB even at rest, pulse 100-120) AND [2] SAME as normal    Final Disposition User   See PCP When Office is Open (within 3 days) Dorthula Rue, RN, Enrique Sack    Comments  Appointment scheduled on 03/02 at 1030.   Referrals  REFERRED TO PCP OFFICE           Disagree/Comply: Comply

## 2016-01-11 NOTE — Telephone Encounter (Signed)
Appt scheduled for tomorrow with Dr. Clent Ridges - Lorain Childes

## 2016-01-12 ENCOUNTER — Encounter: Payer: Self-pay | Admitting: Family Medicine

## 2016-01-12 ENCOUNTER — Ambulatory Visit (INDEPENDENT_AMBULATORY_CARE_PROVIDER_SITE_OTHER): Payer: 59 | Admitting: Family Medicine

## 2016-01-12 VITALS — BP 126/85 | HR 81 | Temp 98.1°F | Ht 66.0 in | Wt 235.0 lb

## 2016-01-12 DIAGNOSIS — J309 Allergic rhinitis, unspecified: Secondary | ICD-10-CM | POA: Diagnosis not present

## 2016-01-12 NOTE — Progress Notes (Signed)
Pre visit review using our clinic review tool, if applicable. No additional management support is needed unless otherwise documented below in the visit note. 

## 2016-01-12 NOTE — Progress Notes (Signed)
   Subjective:    Patient ID: [name] Kitchen, male    DOB: 09-26-1976, 40 y.o.   MRN: [id number]  HPI Here for a month of stuffy head, PND, sneezing, and chest congestion. Some coughing but not much. No fever. He tried Mucinex with no relief.    Review of Systems  Constitutional: Negative.   HENT: Positive for congestion, postnasal drip, rhinorrhea and sneezing. Negative for sinus pressure and sore throat.   Eyes: Negative.   Respiratory: Positive for cough and chest tightness. Negative for wheezing.   Cardiovascular: Negative.        Objective:   Physical Exam  Constitutional: He appears well-developed and well-nourished.  HENT:  Right Ear: External ear normal.  Left Ear: External ear normal.  Nose: Nose normal.  Mouth/Throat: Oropharynx is clear and moist.  Eyes: Conjunctivae are normal.  Neck: No thyromegaly present.  Cardiovascular: Normal rate, regular rhythm, normal heart sounds and intact distal pulses.   Pulmonary/Chest: Effort normal and breath sounds normal.  Lymphadenopathy:    He has no cervical adenopathy.          Assessment & Plan:  Allergies, try Flonase and Claritin daily.

## 2016-02-14 ENCOUNTER — Other Ambulatory Visit: Payer: Self-pay | Admitting: Family Medicine

## 2016-02-14 ENCOUNTER — Encounter: Payer: Self-pay | Admitting: Family Medicine

## 2016-02-14 ENCOUNTER — Ambulatory Visit (INDEPENDENT_AMBULATORY_CARE_PROVIDER_SITE_OTHER): Payer: 59 | Admitting: Family Medicine

## 2016-02-14 VITALS — BP 130/73 | HR 114 | Temp 98.8°F | Ht 66.0 in | Wt 237.0 lb

## 2016-02-14 DIAGNOSIS — N390 Urinary tract infection, site not specified: Secondary | ICD-10-CM | POA: Diagnosis not present

## 2016-02-14 LAB — POC URINALSYSI DIPSTICK (AUTOMATED)
Bilirubin, UA: NEGATIVE
Blood, UA: NEGATIVE
Glucose, UA: NEGATIVE
Ketones, UA: NEGATIVE
Nitrite, UA: NEGATIVE
Spec Grav, UA: 1.025
Urobilinogen, UA: 0.2
pH, UA: 6

## 2016-02-14 MED ORDER — CIPROFLOXACIN HCL 500 MG PO TABS: 500.0000 mg | ORAL_TABLET | Freq: Two times a day (BID) | ORAL | Status: DC

## 2016-02-14 NOTE — Progress Notes (Signed)
   Subjective:    Patient ID: Brent Watson, male    DOB: June 04, 1976, 40 y.o.   MRN: [id number]  HPI Here for one week of urinary frequency and mild burning. No DC. No back pain or fever or nausea.   Review of Systems  Constitutional: Negative.   Gastrointestinal: Negative.   Genitourinary: Positive for dysuria and frequency. Negative for hematuria, flank pain, discharge, difficulty urinating and testicular pain.       Objective:   Physical Exam  Constitutional: He appears well-developed and well-nourished.  Abdominal: Soft. Bowel sounds are normal. He exhibits no distension and no mass. There is no tenderness. There is no rebound and no guarding.  Genitourinary:  No testicular tenderness          Assessment & Plan:  UTI, treat with Cipro 500 mg bid for 14 days. Culture the sample Nelwyn SalisburyFRY,Rashea Hoskie A, MD

## 2016-02-14 NOTE — Progress Notes (Signed)
Pre visit review using our clinic review tool, if applicable. No additional management support is needed unless otherwise documented below in the visit note. 

## 2016-02-15 LAB — GC/CHLAMYDIA PROBE AMP
CT Probe RNA: NOT DETECTED
GC Probe RNA: NOT DETECTED

## 2016-02-16 LAB — URINE CULTURE
Colony Count: NO GROWTH
Organism ID, Bacteria: NO GROWTH

## 2016-03-10 ENCOUNTER — Telehealth: Payer: Self-pay | Admitting: Family Medicine

## 2016-03-12 ENCOUNTER — Ambulatory Visit (INDEPENDENT_AMBULATORY_CARE_PROVIDER_SITE_OTHER): Payer: 59 | Admitting: Family Medicine

## 2016-03-12 ENCOUNTER — Encounter: Payer: Self-pay | Admitting: Family Medicine

## 2016-03-12 VITALS — BP 130/80 | HR 108 | Temp 98.5°F | Ht 66.0 in | Wt 239.0 lb

## 2016-03-12 DIAGNOSIS — H65192 Other acute nonsuppurative otitis media, left ear: Secondary | ICD-10-CM

## 2016-03-12 MED ORDER — AMOXICILLIN-POT CLAVULANATE 875-125 MG PO TABS: 1.0000 | ORAL_TABLET | Freq: Two times a day (BID) | ORAL | Status: DC

## 2016-03-12 NOTE — Progress Notes (Signed)
Pre visit review using our clinic review tool, if applicable. No additional management support is needed unless otherwise documented below in the visit note. 

## 2016-03-12 NOTE — Progress Notes (Signed)
   Subjective:    Patient ID: Brent Kitchenravis L Watson, male    DOB: [birth year], 40 y.o.   MRN: [id number]  HPI Here for 3 days of pain and decreased hearing in the left ear. No sinus symptoms. No fever.    Review of Systems  Constitutional: Negative.   HENT: Positive for ear pain and hearing loss. Negative for congestion, ear discharge, facial swelling, postnasal drip, sinus pressure and sore throat.   Eyes: Negative.   Respiratory: Negative.        Objective:   Physical Exam  Constitutional: He appears well-developed and well-nourished.  HENT:  Right Ear: External ear normal.  Nose: Nose normal.  Mouth/Throat: Oropharynx is clear and moist.  Left TM is red and swollen   Eyes: Conjunctivae are normal.  Neck: No thyromegaly present.  Pulmonary/Chest: Effort normal and breath sounds normal.  Lymphadenopathy:    He has no cervical adenopathy.          Assessment & Plan:  Otitis media, treat with Augmentin. Add Ibuprofen prn.  Nelwyn SalisburyFRY,Paz Fuentes A, MD

## 2016-03-12 NOTE — Telephone Encounter (Signed)
Pt is on the schedule to see Dr. Clent RidgesFry today 03/12/2016.

## 2016-03-12 NOTE — Telephone Encounter (Signed)
[facility] - Client TELEPHONE ADVICE RECORD [facility] Patient Name: Brent GermanRAVIS Watson Gender: Male DOB: 1976-04-01 Age: [age] Y 5 M 8 D Return Phone Number: [phone] (Primary) Address: [place]: [place] [id number] Client [facility] - Client Client Site  [facility] Physician Gershon CraneFry, Stephen - MD Contact Type Call Who Is Calling Patient / Member / Family / Caregiver Call Type Triage / Clinical Relationship To Patient Self Return Phone Number [phone] (Primary) Chief Complaint Earache Reason for Call Symptomatic / Request for Health Information Initial Comment Caller states last night before bed his left ear started hurting on inside. Sometimes when he's sitting at a certain ankle, it feels like something is draining but it won't drain completely. Taking Ibuprofen for pain PreDisposition Call Doctor Translation No Nurse Assessment Nurse: Lynwood DawleyMatherly, RN, [place]berley Date/Time (Eastern Time): 03/10/2016 12:27:10 PM Confirm and document reason for call. If symptomatic, describe symptoms. You must click the next button to save text entered. ---caller states having ear pain. unknown temp. OTC pain med per caller Has the patient traveled out of the country within the last 30 days? ---No Does the patient have any new or worsening symptoms? ---Yes Will a triage be completed? ---Yes Related visit to physician within the last 2 weeks? ---No Does the PT have any chronic conditions? (i.e. diabetes, asthma, etc.) ---No Is this a behavioral health or substance abuse call? ---No Guidelines Guideline Title Affirmed Question Affirmed Notes Nurse Date/Time (Eastern Time) Earache Earache (Exceptions: brief ear pain of < 60 minutes duration, earache occurring during air travel Tres ArroyosMatherly, RN, [place]Kimberley 03/10/2016 12:28:47 PM Disp. Time Lamount Cohen(Eastern Time) Disposition Final User 03/10/2016  12:32:12 PM See Physician within 24 Hours Yes Matherly, RN, [place]Kimberley PLEASE NOTE: All timestamps contained within this report are represented as [place] Standard Time. CONFIDENTIALTY NOTICE: This fax transmission is intended only for the addressee. It contains information that is legally privileged, confidential or otherwise protected from use or disclosure. If you are not the intended recipient, you are strictly prohibited from reviewing, disclosing, copying using or disseminating any of this information or taking any action in reliance on or regarding this information. If you have received this fax in error, please notify us immediately by telephone so that we can arrange for its return to us. Phone: [phone], Toll-Free: [phone], Fax: [phone] Page: 2 of 2 Call Id: [id number] Caller Understands: Yes Disagree/Comply: Disagree Disagree/Comply Reason: Wait and see Care Advice Given Per Guideline SEE PHYSICIAN WITHIN 24 HOURS: PAIN MEDICINES: CALL BACK IF: * You become worse. CARE ADVICE given per Earache (Adult) guideline. Referrals REFERRED TO PCP OFFICE

## 2016-07-14 ENCOUNTER — Other Ambulatory Visit: Payer: Self-pay | Admitting: Family Medicine

## 2016-10-03 ENCOUNTER — Encounter: Payer: 59 | Admitting: Family Medicine

## 2016-10-10 ENCOUNTER — Ambulatory Visit (INDEPENDENT_AMBULATORY_CARE_PROVIDER_SITE_OTHER): Payer: 59 | Admitting: Family Medicine

## 2016-10-10 ENCOUNTER — Encounter: Payer: Self-pay | Admitting: Family Medicine

## 2016-10-10 VITALS — BP 128/86 | HR 89 | Temp 97.5°F | Ht 66.0 in | Wt 238.0 lb

## 2016-10-10 DIAGNOSIS — Z0000 Encounter for general adult medical examination without abnormal findings: Secondary | ICD-10-CM

## 2016-10-10 LAB — BASIC METABOLIC PANEL
BUN: 16 mg/dL (ref 6–23)
CO2: 31 mEq/L (ref 19–32)
Calcium: 9.8 mg/dL (ref 8.4–10.5)
Chloride: 103 mEq/L (ref 96–112)
Creatinine, Ser: 1.27 mg/dL (ref 0.40–1.50)
GFR: 81.61 mL/min (ref >60.00)
Glucose, Bld: 99 mg/dL (ref 70–99)
Potassium: 4.1 mEq/L (ref 3.5–5.1)
Sodium: 140 mEq/L (ref 135–145)

## 2016-10-10 LAB — POC URINALSYSI DIPSTICK (AUTOMATED)
Bilirubin, UA: NEGATIVE
Blood, UA: NEGATIVE
Glucose, UA: NEGATIVE
Ketones, UA: NEGATIVE
Leukocytes, UA: NEGATIVE
Nitrite, UA: NEGATIVE
Spec Grav, UA: 1.025
Urobilinogen, UA: 0.2
pH, UA: 5.5

## 2016-10-10 LAB — LIPID PANEL
Cholesterol: 220 mg/dL — ABNORMAL HIGH (ref 0–200)
HDL: 40.9 mg/dL (ref >39.00)
LDL Cholesterol: 161 mg/dL — ABNORMAL HIGH (ref 0–99)
NonHDL: 178.79
Total CHOL/HDL Ratio: 5
Triglycerides: 91 mg/dL (ref 0.0–149.0)
VLDL: 18.2 mg/dL (ref 0.0–40.0)

## 2016-10-10 LAB — HEPATIC FUNCTION PANEL
ALT: 24 U/L (ref 0–53)
AST: 18 U/L (ref 0–37)
Albumin: 4.8 g/dL (ref 3.5–5.2)
Alkaline Phosphatase: 43 U/L (ref 39–117)
Bilirubin, Direct: 0.1 mg/dL (ref 0.0–0.3)
Total Bilirubin: 0.7 mg/dL (ref 0.2–1.2)
Total Protein: 7.4 g/dL (ref 6.0–8.3)

## 2016-10-10 LAB — CBC WITH DIFFERENTIAL/PLATELET
Basophils Absolute: 0 10*3/uL (ref 0.0–0.1)
Basophils Relative: 0.4 % (ref 0.0–3.0)
Eosinophils Absolute: 0.2 10*3/uL (ref 0.0–0.7)
Eosinophils Relative: 3.4 % (ref 0.0–5.0)
HCT: 43.2 % (ref 39.0–52.0)
Hemoglobin: 14.3 g/dL (ref 13.0–17.0)
Lymphocytes Relative: 24.9 % (ref 12.0–46.0)
Lymphs Abs: 1.5 10*3/uL (ref 0.7–4.0)
MCHC: 33 g/dL (ref 30.0–36.0)
MCV: 85.7 fl (ref 78.0–100.0)
Monocytes Absolute: 0.4 10*3/uL (ref 0.1–1.0)
Monocytes Relative: 6 % (ref 3.0–12.0)
Neutro Abs: 4 10*3/uL (ref 1.4–7.7)
Neutrophils Relative %: 65.3 % (ref 43.0–77.0)
Platelets: 264 10*3/uL (ref 150.0–400.0)
RBC: 5.04 Mil/uL (ref 4.22–5.81)
RDW: 14.6 % (ref 11.5–15.5)
WBC: 6.2 10*3/uL (ref 4.0–10.5)

## 2016-10-10 LAB — TSH: TSH: 1.03 u[IU]/mL (ref 0.35–4.50)

## 2016-10-10 MED ORDER — ATORVASTATIN CALCIUM 20 MG PO TABS
ORAL_TABLET | ORAL | 3 refills | Status: DC
Start: 2016-10-10 — End: 2019-02-11

## 2016-10-10 MED ORDER — OMEPRAZOLE 40 MG PO CPDR: DELAYED_RELEASE_CAPSULE | ORAL | 3 refills | Status: DC

## 2016-10-10 NOTE — Progress Notes (Signed)
   Subjective:    Patient ID: Brent Kitchenravis L Watson, male    DOB: [birth year], 40 y.o.   MRN: [id number]  HPI 40 yr old male for a well exam. He feels fine.    Review of Systems  Constitutional: Negative.   HENT: Negative.   Eyes: Negative.   Respiratory: Negative.   Cardiovascular: Negative.   Gastrointestinal: Negative.   Genitourinary: Negative.   Musculoskeletal: Negative.   Skin: Negative.   Neurological: Negative.   Psychiatric/Behavioral: Negative.        Objective:   Physical Exam  Constitutional: He is oriented to person, place, and time. He appears well-developed and well-nourished. No distress.  HENT:  Head: Normocephalic and atraumatic.  Right Ear: External ear normal.  Left Ear: External ear normal.  Nose: Nose normal.  Mouth/Throat: Oropharynx is clear and moist. No oropharyngeal exudate.  Eyes: Conjunctivae and EOM are normal. Pupils are equal, round, and reactive to light. Right eye exhibits no discharge. Left eye exhibits no discharge. No scleral icterus.  Neck: Neck supple. No JVD present. No tracheal deviation present. No thyromegaly present.  Cardiovascular: Normal rate, regular rhythm, normal heart sounds and intact distal pulses.  Exam reveals no gallop and no friction rub.   No murmur heard. Pulmonary/Chest: Effort normal and breath sounds normal. No respiratory distress. He has no wheezes. He has no rales. He exhibits no tenderness.  Abdominal: Soft. Bowel sounds are normal. He exhibits no distension and no mass. There is no tenderness. There is no rebound and no guarding.  Genitourinary: Penis normal. No penile tenderness.  Musculoskeletal: Normal range of motion. He exhibits no edema or tenderness.  Lymphadenopathy:    He has no cervical adenopathy.  Neurological: He is alert and oriented to person, place, and time. He has normal reflexes. No cranial nerve deficit. He exhibits normal muscle tone. Coordination normal.  Skin: Skin is warm and dry. No rash  noted. He is not diaphoretic. No erythema. No pallor.  Psychiatric: He has a normal mood and affect. His behavior is normal. Judgment and thought content normal.          Assessment & Plan:  Well exam. We discussed diet and exercise. Get fasting labs today. Nelwyn SalisburyFRY,Ember Gottwald A, MD

## 2016-10-10 NOTE — Progress Notes (Signed)
Pre visit review using our clinic review tool, if applicable. No additional management support is needed unless otherwise documented below in the visit note. 

## 2016-10-12 ENCOUNTER — Other Ambulatory Visit: Payer: Self-pay | Admitting: Family Medicine

## 2016-12-28 ENCOUNTER — Telehealth: Payer: Self-pay | Admitting: Family Medicine

## 2016-12-28 ENCOUNTER — Ambulatory Visit (INDEPENDENT_AMBULATORY_CARE_PROVIDER_SITE_OTHER): Payer: 59 | Admitting: Family Medicine

## 2016-12-28 ENCOUNTER — Encounter: Payer: Self-pay | Admitting: Family Medicine

## 2016-12-28 VITALS — BP 126/84 | Temp 98.3°F | Ht 66.0 in | Wt 232.0 lb

## 2016-12-28 DIAGNOSIS — K219 Gastro-esophageal reflux disease without esophagitis: Secondary | ICD-10-CM

## 2016-12-28 NOTE — Progress Notes (Signed)
Pre visit review using our clinic review tool, if applicable. No additional management support is needed unless otherwise documented below in the visit note. 

## 2016-12-28 NOTE — Telephone Encounter (Signed)
Noted  

## 2016-12-28 NOTE — Progress Notes (Signed)
   Subjective:    Patient ID: Brent Kitchenravis L Watson, male    DOB: 03/02/1976, 41 y.o.   MRN: [id number]  HPI Here for 3 days of intermittent pressure type pains in the chest. These only last a few minutes at a time. They are not related to exertion. No associated SOB or nausea or sweats. No trouble swallowing. No coughing or URI symptoms. He takes Omeprazole 40 mg every morning. He has been under more atress this week than usual and he leaves tomorrow on [place] 2 week business trip to [place].    Review of Systems  Constitutional: Negative.   HENT: Negative.   Eyes: Negative.   Respiratory: Negative.   Cardiovascular: Positive for chest pain. Negative for palpitations and leg swelling.  Gastrointestinal: Negative.        Objective:   Physical Exam  Constitutional: He appears well-developed and well-nourished. No distress.  Neck: No thyromegaly present.  Cardiovascular: Normal rate, regular rhythm, normal heart sounds and intact distal pulses.   Pulmonary/Chest: Effort normal and breath sounds normal. No respiratory distress. He has no wheezes. He has no rales. He exhibits no tenderness.  Abdominal: Soft. Bowel sounds are normal. He exhibits no distension and no mass. There is no tenderness. There is no rebound and no guarding.  Lymphadenopathy:    He has no cervical adenopathy.          Assessment & Plan:  This sounds like breakthrough GERD causing some esophageal spasms. I suggested he add a 150 mg Zantac every evening before dinner. Recheck prn.  Gershon CraneStephen Ovila Lepage, MD

## 2016-12-28 NOTE — Telephone Encounter (Signed)
Patient Name: Brent Watson  DOB: 21-Feb-1976    Initial Comment Caller states past 2 days having chest tightness;    Nurse Assessment  Nurse: Stefano GaulStringer, RN, Dwana CurdVera Date/Time (Eastern Time): 12/28/2016 10:04:36 AM  Confirm and document reason for call. If symptomatic, describe symptoms. ---Caller states he has had chest tightness for a couple of days. He is constipated. No cough. No SOB. He does not have pain when he takes a deep breath.  Does the patient have any new or worsening symptoms? ---Yes  Will a triage be completed? ---Yes  Related visit to physician within the last 2 weeks? ---No  Does the PT have any chronic conditions? (i.e. diabetes, asthma, etc.) ---Yes  List chronic conditions. ---GERD  Is this a behavioral health or substance abuse call? ---No     Guidelines    Guideline Title Affirmed Question Affirmed Notes  Chest Pain Chest pain lasts > 5 minutes (Exceptions: chest pain occurring > 3 days ago and now asymptomatic; same as previously diagnosed heartburn and has accompanying sour taste in mouth)    Final Disposition User   Go to ED Now (or PCP triage) Stefano GaulStringer, RN, Vera    Comments  office is open; appt scheduled vs sending pt to ER. Appt scheduled for 12/28/16 at 11:30 am with Dr. Gershon CraneStephen Fry   Referrals  REFERRED TO PCP OFFICE   Disagree/Comply: Comply

## 2017-04-05 ENCOUNTER — Telehealth: Payer: Self-pay | Admitting: Family Medicine

## 2017-04-05 NOTE — Telephone Encounter (Signed)
° ° ° ° °  Pt said his insurance company is requesting a PA  On the below med. Pt said over counter of the below med does not work for him   omeprazole (PRILOSEC) 40 MG capsule

## 2017-04-05 NOTE — Telephone Encounter (Signed)
PA pending. ZOX:[id number]:[id number]

## 2017-04-09 NOTE — Telephone Encounter (Signed)
I spoke with pt and went over this information. He will check with his insurance company and find out if there is an alternative, will let us know if he decides to try something else.

## 2017-04-09 NOTE — Telephone Encounter (Signed)
Pt calling back stating that is Dr. Clent RidgesFry could send in Rx pantoprazole this is one that the insurance will cover. Pharm:  [facility], [place]

## 2017-04-09 NOTE — Telephone Encounter (Signed)
PA denied. Omeprazole is not covered by prescription benefit plan.

## 2017-04-15 MED ORDER — PANTOPRAZOLE SODIUM 40 MG PO TBEC: 40.0000 mg | DELAYED_RELEASE_TABLET | Freq: Every day | ORAL | 3 refills | Status: DC

## 2017-04-15 NOTE — Telephone Encounter (Signed)
Stop Omeprazole and call in Pantoprazole 40 mg daily, #90 with 3 rf

## 2017-04-15 NOTE — Telephone Encounter (Signed)
I [facility] to [facility] and spoke with pt.  

## 2017-04-15 NOTE — Addendum Note (Signed)
Addended by: Aniceto BossNIMMONS, Roselee Tayloe A on: 04/15/2017 11:33 AM   Modules accepted: Orders

## 2017-10-16 ENCOUNTER — Encounter: Payer: Self-pay | Admitting: Family Medicine

## 2017-10-16 ENCOUNTER — Ambulatory Visit (INDEPENDENT_AMBULATORY_CARE_PROVIDER_SITE_OTHER): Payer: 59 | Admitting: Family Medicine

## 2017-10-16 VITALS — BP 138/80 | HR 95 | Temp 98.3°F | Ht 66.0 in | Wt 236.0 lb

## 2017-10-16 DIAGNOSIS — Z0000 Encounter for general adult medical examination without abnormal findings: Secondary | ICD-10-CM

## 2017-10-16 LAB — BASIC METABOLIC PANEL
BUN: 13 mg/dL (ref 6–23)
CO2: 30 mEq/L (ref 19–32)
Calcium: 9.4 mg/dL (ref 8.4–10.5)
Chloride: 103 mEq/L (ref 96–112)
Creatinine, Ser: 1.3 mg/dL (ref 0.40–1.50)
GFR: 79.02 mL/min (ref >60.00)
Glucose, Bld: 94 mg/dL (ref 70–99)
Potassium: 4 mEq/L (ref 3.5–5.1)
Sodium: 140 mEq/L (ref 135–145)

## 2017-10-16 LAB — HEPATIC FUNCTION PANEL
ALT: 20 U/L (ref 0–53)
AST: 16 U/L (ref 0–37)
Albumin: 4.8 g/dL (ref 3.5–5.2)
Alkaline Phosphatase: 35 U/L — ABNORMAL LOW (ref 39–117)
Bilirubin, Direct: 0.1 mg/dL (ref 0.0–0.3)
Total Bilirubin: 0.8 mg/dL (ref 0.2–1.2)
Total Protein: 7.2 g/dL (ref 6.0–8.3)

## 2017-10-16 LAB — CBC WITH DIFFERENTIAL/PLATELET
Basophils Absolute: 0 10*3/uL (ref 0.0–0.1)
Basophils Relative: 0.5 % (ref 0.0–3.0)
Eosinophils Absolute: 0.4 10*3/uL (ref 0.0–0.7)
Eosinophils Relative: 4.9 % (ref 0.0–5.0)
HCT: 44.2 % (ref 39.0–52.0)
Hemoglobin: 14.5 g/dL (ref 13.0–17.0)
Lymphocytes Relative: 19 % (ref 12.0–46.0)
Lymphs Abs: 1.5 10*3/uL (ref 0.7–4.0)
MCHC: 32.8 g/dL (ref 30.0–36.0)
MCV: 88.6 fl (ref 78.0–100.0)
Monocytes Absolute: 0.5 10*3/uL (ref 0.1–1.0)
Monocytes Relative: 5.8 % (ref 3.0–12.0)
Neutro Abs: 5.7 10*3/uL (ref 1.4–7.7)
Neutrophils Relative %: 69.8 % (ref 43.0–77.0)
Platelets: 269 10*3/uL (ref 150.0–400.0)
RBC: 4.99 Mil/uL (ref 4.22–5.81)
RDW: 13.8 % (ref 11.5–15.5)
WBC: 8.1 10*3/uL (ref 4.0–10.5)

## 2017-10-16 LAB — LIPID PANEL
Cholesterol: 170 mg/dL (ref 0–200)
HDL: 40.7 mg/dL (ref >39.00)
LDL Cholesterol: 109 mg/dL — ABNORMAL HIGH (ref 0–99)
NonHDL: 128.84
Total CHOL/HDL Ratio: 4
Triglycerides: 98 mg/dL (ref 0.0–149.0)
VLDL: 19.6 mg/dL (ref 0.0–40.0)

## 2017-10-16 LAB — TSH: TSH: 1.6 u[IU]/mL (ref 0.35–4.50)

## 2017-10-16 NOTE — Progress Notes (Signed)
   Subjective:    Patient ID: Brent Kitchenravis L Watson, male    DOB: Oct 26, 1976, 41 y.o.   MRN: [id number]  HPI Here for a well exam. He feels fine.    Review of Systems  Constitutional: Negative.   HENT: Negative.   Eyes: Negative.   Respiratory: Negative.   Cardiovascular: Negative.   Gastrointestinal: Negative.   Genitourinary: Negative.   Musculoskeletal: Negative.   Skin: Negative.   Neurological: Negative.   Psychiatric/Behavioral: Negative.        Objective:   Physical Exam  Constitutional: He is oriented to person, place, and time. He appears well-developed and well-nourished. No distress.  HENT:  Head: Normocephalic and atraumatic.  Right Ear: External ear normal.  Left Ear: External ear normal.  Nose: Nose normal.  Mouth/Throat: Oropharynx is clear and moist. No oropharyngeal exudate.  Eyes: Conjunctivae and EOM are normal. Pupils are equal, round, and reactive to light. Right eye exhibits no discharge. Left eye exhibits no discharge. No scleral icterus.  Neck: Neck supple. No JVD present. No tracheal deviation present. No thyromegaly present.  Cardiovascular: Normal rate, regular rhythm, normal heart sounds and intact distal pulses. Exam reveals no gallop and no friction rub.  No murmur heard. Pulmonary/Chest: Effort normal and breath sounds normal. No respiratory distress. He has no wheezes. He has no rales. He exhibits no tenderness.  Abdominal: Soft. Bowel sounds are normal. He exhibits no distension and no mass. There is no tenderness. There is no rebound and no guarding.  Genitourinary: Rectum normal, prostate normal and penis normal. Rectal exam shows guaiac negative stool. No penile tenderness.  Musculoskeletal: Normal range of motion. He exhibits no edema or tenderness.  Lymphadenopathy:    He has no cervical adenopathy.  Neurological: He is alert and oriented to person, place, and time. He has normal reflexes. No cranial nerve deficit. He exhibits normal muscle  tone. Coordination normal.  Skin: Skin is warm and dry. No rash noted. He is not diaphoretic. No erythema. No pallor.  Psychiatric: He has a normal mood and affect. His behavior is normal. Judgment and thought content normal.          Assessment & Plan:  Well exam. We discussed diet and exercise. Get fasting labs.  Gershon CraneStephen Kimerly Rowand, MD

## 2018-01-16 DIAGNOSIS — H40013 Open angle with borderline findings, low risk, bilateral: Secondary | ICD-10-CM | POA: Diagnosis not present

## 2018-07-11 ENCOUNTER — Other Ambulatory Visit: Payer: Self-pay | Admitting: Family Medicine

## 2018-07-21 ENCOUNTER — Encounter: Payer: Self-pay | Admitting: Family Medicine

## 2018-07-21 ENCOUNTER — Ambulatory Visit: Payer: 59 | Admitting: Family Medicine

## 2018-07-21 VITALS — BP 138/80 | HR 105 | Temp 98.6°F | Ht 66.0 in | Wt 236.0 lb

## 2018-07-21 DIAGNOSIS — K219 Gastro-esophageal reflux disease without esophagitis: Secondary | ICD-10-CM

## 2018-07-21 DIAGNOSIS — K581 Irritable bowel syndrome with constipation: Secondary | ICD-10-CM

## 2018-07-21 NOTE — Progress Notes (Signed)
   Subjective:    Patient ID: [name] Kitchen, male    DOB: 07-Jul-1976, 42 y.o.   MRN: [id number]  HPI Here for intermittent mild pains in the RLQ of the abdomen. These started about 2 months ago. No nausea or fever. No urinary symptoms. He has 2 BMs every day but they tend to consist of a few small bits of stool. He takes Protonix and Activa yogurt daily. He drinks plenty of water.    Review of Systems  Constitutional: Negative.   Respiratory: Negative.   Cardiovascular: Negative.   Gastrointestinal: Positive for abdominal pain. Negative for abdominal distention, anal bleeding, blood in stool, constipation, diarrhea, nausea, rectal pain and vomiting.  Genitourinary: Negative.        Objective:   Physical Exam  Constitutional: He appears well-developed and well-nourished. No distress.  Cardiovascular: Normal rate, regular rhythm, normal heart sounds and intact distal pulses.  Pulmonary/Chest: Effort normal and breath sounds normal.  Abdominal: Soft. Bowel sounds are normal. He exhibits no distension and no mass. There is no tenderness. There is no rebound and no guarding. No hernia.          Assessment & Plan:  He seems to have some IBS, with some intermittent constipation. Try Miralax daily.  Gershon Crane, MD

## 2018-10-04 ENCOUNTER — Other Ambulatory Visit: Payer: Self-pay | Admitting: Family Medicine

## 2018-10-17 ENCOUNTER — Ambulatory Visit (INDEPENDENT_AMBULATORY_CARE_PROVIDER_SITE_OTHER): Payer: 59 | Admitting: Family Medicine

## 2018-10-17 ENCOUNTER — Encounter: Payer: Self-pay | Admitting: Family Medicine

## 2018-10-17 VITALS — BP 126/80 | HR 108 | Temp 98.2°F | Wt 236.8 lb

## 2018-10-17 DIAGNOSIS — Z0000 Encounter for general adult medical examination without abnormal findings: Secondary | ICD-10-CM

## 2018-10-17 NOTE — Progress Notes (Signed)
   Subjective:    Patient ID: [name] Kitchenravis L Lightle, male    DOB: 05/19/1976, 42 y.o.   MRN: [id number]  HPI Here for a well exam. He feels fine.    Review of Systems  Constitutional: Negative.   HENT: Negative.   Eyes: Negative.   Respiratory: Negative.   Cardiovascular: Negative.   Gastrointestinal: Negative.   Genitourinary: Negative.   Musculoskeletal: Negative.   Skin: Negative.   Neurological: Negative.   Psychiatric/Behavioral: Negative.        Objective:   Physical Exam  Constitutional: He is oriented to person, place, and time. He appears well-developed and well-nourished. No distress.  HENT:  Head: Normocephalic and atraumatic.  Right Ear: External ear normal.  Left Ear: External ear normal.  Nose: Nose normal.  Mouth/Throat: Oropharynx is clear and moist. No oropharyngeal exudate.  Eyes: Pupils are equal, round, and reactive to light. Conjunctivae and EOM are normal. Right eye exhibits no discharge. Left eye exhibits no discharge. No scleral icterus.  Neck: Neck supple. No JVD present. No tracheal deviation present. No thyromegaly present.  Cardiovascular: Normal rate, regular rhythm, normal heart sounds and intact distal pulses. Exam reveals no gallop and no friction rub.  No murmur heard. Pulmonary/Chest: Effort normal and breath sounds normal. No respiratory distress. He has no wheezes. He has no rales. He exhibits no tenderness.  Abdominal: Soft. Bowel sounds are normal. He exhibits no distension and no mass. There is no tenderness. There is no rebound and no guarding.  Genitourinary: Rectum normal, prostate normal and penis normal. Rectal exam shows guaiac negative stool. No penile tenderness.  Musculoskeletal: Normal range of motion. He exhibits no edema or tenderness.  Lymphadenopathy:    He has no cervical adenopathy.  Neurological: He is alert and oriented to person, place, and time. He has normal reflexes. He displays normal reflexes. No cranial nerve deficit.  He exhibits normal muscle tone. Coordination normal.  Skin: Skin is warm and dry. No rash noted. He is not diaphoretic. No erythema. No pallor.  Psychiatric: He has a normal mood and affect. His behavior is normal. Judgment and thought content normal.          Assessment & Plan:  Well exam. We discussed diet and exercise. Get fasting labs soon. Gershon CraneStephen Jonnie Truxillo, MD

## 2018-10-20 ENCOUNTER — Other Ambulatory Visit (INDEPENDENT_AMBULATORY_CARE_PROVIDER_SITE_OTHER): Payer: 59

## 2018-10-20 DIAGNOSIS — Z0000 Encounter for general adult medical examination without abnormal findings: Secondary | ICD-10-CM

## 2018-10-20 LAB — CBC WITH DIFFERENTIAL/PLATELET
Basophils Absolute: 0 10*3/uL (ref 0.0–0.1)
Basophils Relative: 0.3 % (ref 0.0–3.0)
Eosinophils Absolute: 0.2 10*3/uL (ref 0.0–0.7)
Eosinophils Relative: 3.1 % (ref 0.0–5.0)
HCT: 44.3 % (ref 39.0–52.0)
Hemoglobin: 14.6 g/dL (ref 13.0–17.0)
Lymphocytes Relative: 25.5 % (ref 12.0–46.0)
Lymphs Abs: 1.6 10*3/uL (ref 0.7–4.0)
MCHC: 32.9 g/dL (ref 30.0–36.0)
MCV: 87.6 fl (ref 78.0–100.0)
Monocytes Absolute: 0.4 10*3/uL (ref 0.1–1.0)
Monocytes Relative: 6.6 % (ref 3.0–12.0)
Neutro Abs: 4.1 10*3/uL (ref 1.4–7.7)
Neutrophils Relative %: 64.5 % (ref 43.0–77.0)
Platelets: 283 10*3/uL (ref 150.0–400.0)
RBC: 5.06 Mil/uL (ref 4.22–5.81)
RDW: 14.5 % (ref 11.5–15.5)
WBC: 6.3 10*3/uL (ref 4.0–10.5)

## 2018-10-20 LAB — LIPID PANEL
Cholesterol: 188 mg/dL (ref 0–200)
HDL: 42 mg/dL (ref >39.00)
LDL Cholesterol: 123 mg/dL — ABNORMAL HIGH (ref 0–99)
NonHDL: 145.9
Total CHOL/HDL Ratio: 4
Triglycerides: 116 mg/dL (ref 0.0–149.0)
VLDL: 23.2 mg/dL (ref 0.0–40.0)

## 2018-10-20 LAB — HEPATIC FUNCTION PANEL
ALT: 32 U/L (ref 0–53)
AST: 23 U/L (ref 0–37)
Albumin: 4.7 g/dL (ref 3.5–5.2)
Alkaline Phosphatase: 35 U/L — ABNORMAL LOW (ref 39–117)
Bilirubin, Direct: 0.1 mg/dL (ref 0.0–0.3)
Total Bilirubin: 0.7 mg/dL (ref 0.2–1.2)
Total Protein: 7.3 g/dL (ref 6.0–8.3)

## 2018-10-20 LAB — BASIC METABOLIC PANEL
BUN: 17 mg/dL (ref 6–23)
CO2: 30 mEq/L (ref 19–32)
Calcium: 10.1 mg/dL (ref 8.4–10.5)
Chloride: 103 mEq/L (ref 96–112)
Creatinine, Ser: 1.29 mg/dL (ref 0.40–1.50)
GFR: 79.31 mL/min (ref >60.00)
Glucose, Bld: 105 mg/dL — ABNORMAL HIGH (ref 70–99)
Potassium: 4.2 mEq/L (ref 3.5–5.1)
Sodium: 140 mEq/L (ref 135–145)

## 2018-10-20 LAB — TSH: TSH: 1.34 u[IU]/mL (ref 0.35–4.50)

## 2018-10-20 LAB — PSA: PSA: 0.42 ng/mL (ref 0.10–4.00)

## 2018-11-18 DIAGNOSIS — R194 Change in bowel habit: Secondary | ICD-10-CM | POA: Diagnosis not present

## 2018-11-18 DIAGNOSIS — K219 Gastro-esophageal reflux disease without esophagitis: Secondary | ICD-10-CM | POA: Diagnosis not present

## 2019-01-19 DIAGNOSIS — H40013 Open angle with borderline findings, low risk, bilateral: Secondary | ICD-10-CM | POA: Diagnosis not present

## 2019-01-19 DIAGNOSIS — H5213 Myopia, bilateral: Secondary | ICD-10-CM | POA: Diagnosis not present

## 2019-01-19 DIAGNOSIS — H52222 Regular astigmatism, left eye: Secondary | ICD-10-CM | POA: Diagnosis not present

## 2019-02-11 ENCOUNTER — Other Ambulatory Visit: Payer: Self-pay | Admitting: Family Medicine

## 2019-02-11 MED ORDER — PANTOPRAZOLE SODIUM 40 MG PO TBEC: 40.0000 mg | DELAYED_RELEASE_TABLET | Freq: Every day | ORAL | 0 refills | Status: DC

## 2019-02-11 MED ORDER — ATORVASTATIN CALCIUM 20 MG PO TABS: ORAL_TABLET | ORAL | 3 refills | Status: DC

## 2019-07-14 ENCOUNTER — Other Ambulatory Visit: Payer: Self-pay

## 2019-07-14 ENCOUNTER — Telehealth (INDEPENDENT_AMBULATORY_CARE_PROVIDER_SITE_OTHER): Payer: 59 | Admitting: Family Medicine

## 2019-07-14 ENCOUNTER — Encounter: Payer: Self-pay | Admitting: Family Medicine

## 2019-07-14 DIAGNOSIS — J069 Acute upper respiratory infection, unspecified: Secondary | ICD-10-CM | POA: Diagnosis not present

## 2019-07-14 NOTE — Progress Notes (Signed)
Virtual Visit via Video Note  I connected with the patient on 07/14/19 at 10:30 AM EDT by a video enabled telemedicine application and verified that I am speaking with the correct person using two identifiers.  Location patient: home Location provider:work or home office Persons participating in the virtual visit: patient, provider  I discussed the limitations of evaluation and management by telemedicine and the availability of in person appointments. The patient expressed understanding and agreed to proceed.   HPI: Here for 2 days of stuffy head, PND, and some chest congestion. No cough or SOB or fever. No body aches or NVD. He is drinking fluids. He has taken some Advil Cold and Sinus as well as an elderberry supplement.   ROS: See pertinent positives and negatives per HPI.  Past Medical History:  Diagnosis Date  . Allergy   . GERD (gastroesophageal reflux disease)   . Hyperlipidemia     Past Surgical History:  Procedure Laterality Date  . bilateral  inguinal hernia repair    . HYDROCELE EXCISION Left 06/10/2015   Procedure: LEFT HYDROCELECTOMY ADULT;  Surgeon: Lowella Bandy, MD;  Location: [facility];  Service: Urology;  Laterality: Left;  [name] Kitchen VASECTOMY Bilateral 06/10/2015   Procedure: BILATERAL VASECTOMY;  Surgeon: Lowella Bandy, MD;  Location: [facility];  Service: Urology;  Laterality: Bilateral;    Family History  Problem Relation Age of Onset  . Sleep apnea Brother   . Diabetes Other   . Hyperlipidemia Other   . Hypertension Other      Current Outpatient Medications:  .  atorvastatin (LIPITOR) 20 MG tablet, Daily, Disp: 90 tablet, Rfl: 3 .  Multiple Vitamin (MULTIVITAMIN) tablet, Take 1 tablet by mouth daily. Reported on 02/14/2016, Disp: , Rfl:  .  pantoprazole (PROTONIX) 40 MG tablet, Take 1 tablet (40 mg total) by mouth daily., Disp: 90 tablet, Rfl: 0  EXAM:  VITALS per patient if applicable:  GENERAL: alert, oriented, appears well and  in no acute distress  HEENT: atraumatic, conjunttiva clear, no obvious abnormalities on inspection of external nose and ears  NECK: normal movements of the head and neck  LUNGS: on inspection no signs of respiratory distress, breathing rate appears normal, no obvious gross SOB, gasping or wheezing  CV: no obvious cyanosis  MS: moves all visible extremities without noticeable abnormality  PSYCH/NEURO: pleasant and cooperative, no obvious depression or anxiety, speech and thought processing grossly intact  ASSESSMENT AND PLAN: Viral URI. He will continue with fluids and OTC cold remedies. Recheck prn.  Alysia Penna, MD  Discussed the following assessment and plan:  No diagnosis found.     I discussed the assessment and treatment plan with the patient. The patient was provided an opportunity to ask questions and all were answered. The patient agreed with the plan and demonstrated an understanding of the instructions.   The patient was advised to call back or seek an in-person evaluation if the symptoms worsen or if the condition fails to improve as anticipated.

## 2019-07-22 ENCOUNTER — Telehealth (INDEPENDENT_AMBULATORY_CARE_PROVIDER_SITE_OTHER): Payer: 59 | Admitting: Family Medicine

## 2019-07-22 ENCOUNTER — Other Ambulatory Visit: Payer: Self-pay

## 2019-07-22 DIAGNOSIS — R0602 Shortness of breath: Secondary | ICD-10-CM

## 2019-07-22 MED ORDER — ALBUTEROL SULFATE HFA 108 (90 BASE) MCG/ACT IN AERS: 2.0000 | INHALATION_SPRAY | RESPIRATORY_TRACT | 5 refills | Status: DC | PRN

## 2019-07-22 NOTE — Progress Notes (Signed)
Virtual Visit via Video Note  I connected with the patient on 07/22/19 at  2:30 PM EDT by a video enabled telemedicine application and verified that I am speaking with the correct person using two identifiers.  Location patient: home Location provider:work or home office Persons participating in the virtual visit: patient, provider  I discussed the limitations of evaluation and management by telemedicine and the availability of in person appointments. The patient expressed understanding and agreed to proceed.   HPI: Here for SOB. About 10 days ago he developed some fever and a dry cough. He rested and drank fluids, and these symptoms resolved. Now over the past week he has developed some SOB with wheezing, especially during exercise. He never had asthma as a child. He has been riding a bicycle every day for a month, and lately this has become difficult because of the SOB. His daughter has asthma, and she has a nebulizer with albuterol at home. Last night Mousa used this and he felt much better afterward. No chest pain or NVD.    ROS: See pertinent positives and negatives per HPI.  Past Medical History:  Diagnosis Date  . Allergy   . GERD (gastroesophageal reflux disease)   . Hyperlipidemia     Past Surgical History:  Procedure Laterality Date  . bilateral  inguinal hernia repair    . HYDROCELE EXCISION Left 06/10/2015   Procedure: LEFT HYDROCELECTOMY ADULT;  Surgeon: Lowella Bandy, MD;  Location: [facility];  Service: Urology;  Laterality: Left;  [name] Kitchen VASECTOMY Bilateral 06/10/2015   Procedure: BILATERAL VASECTOMY;  Surgeon: Lowella Bandy, MD;  Location: [facility];  Service: Urology;  Laterality: Bilateral;    Family History  Problem Relation Age of Onset  . Sleep apnea Brother   . Diabetes Other   . Hyperlipidemia Other   . Hypertension Other      Current Outpatient Medications:  .  albuterol (VENTOLIN HFA) 108 (90 Base) MCG/ACT inhaler, Inhale 2 puffs  into the lungs every 4 (four) hours as needed for wheezing or shortness of breath., Disp: 18 g, Rfl: 5 .  atorvastatin (LIPITOR) 20 MG tablet, Daily, Disp: 90 tablet, Rfl: 3 .  Multiple Vitamin (MULTIVITAMIN) tablet, Take 1 tablet by mouth daily. Reported on 02/14/2016, Disp: , Rfl:  .  pantoprazole (PROTONIX) 40 MG tablet, Take 1 tablet (40 mg total) by mouth daily., Disp: 90 tablet, Rfl: 0  EXAM:  VITALS per patient if applicable:  GENERAL: alert, oriented, appears well and in no acute distress  HEENT: atraumatic, conjunttiva clear, no obvious abnormalities on inspection of external nose and ears  NECK: normal movements of the head and neck  LUNGS: on inspection no signs of respiratory distress, breathing rate appears normal, no obvious gross SOB, gasping or wheezing  CV: no obvious cyanosis  MS: moves all visible extremities without noticeable abnormality  PSYCH/NEURO: pleasant and cooperative, no obvious depression or anxiety, speech and thought processing grossly intact  ASSESSMENT AND PLAN: He seems to have some exercise induced asthma, so we will let him try a Ventolin HFA as needed. I asked him to get a Covid-19 virus test also. Recheck prn.  Alysia Penna, MD  Discussed the following assessment and plan:  No diagnosis found.     I discussed the assessment and treatment plan with the patient. The patient was provided an opportunity to ask questions and all were answered. The patient agreed with the plan and demonstrated an understanding of the instructions.   The  patient was advised to call back or seek an in-person evaluation if the symptoms worsen or if the condition fails to improve as anticipated.

## 2019-07-23 ENCOUNTER — Other Ambulatory Visit: Payer: Self-pay

## 2019-07-23 DIAGNOSIS — Z20822 Contact with and (suspected) exposure to covid-19: Secondary | ICD-10-CM

## 2019-07-24 LAB — NOVEL CORONAVIRUS, NAA: SARS-CoV-2, NAA: NOT DETECTED

## 2019-07-27 ENCOUNTER — Telehealth: Payer: Self-pay

## 2019-07-27 NOTE — Telephone Encounter (Signed)
CRM for notification. See Telephone encounter for: 07/27/19.pls call pt back negative for covid but wanting to see Sarajane Jews asap In person [phone]

## 2019-07-27 NOTE — Telephone Encounter (Signed)
Call in a Zpack  ?

## 2019-07-27 NOTE — Telephone Encounter (Signed)
Please advise 

## 2019-07-27 NOTE — Telephone Encounter (Signed)
No telephone encounter was created by PEC.   Please contact pt and evaluate symptoms and need for visit. Please route to Dr. Sarajane Jews for recommendations.

## 2019-07-27 NOTE — Telephone Encounter (Signed)
Patient called back to speak to the nurse regarding the appointment he requested. Wanted Dr Sarajane Jews to know that he still have some chest and nasal congestion. Can be reached at Ph# [phone]

## 2019-07-28 ENCOUNTER — Other Ambulatory Visit: Payer: Self-pay | Admitting: Family Medicine

## 2019-07-28 MED ORDER — AZITHROMYCIN 250 MG PO TABS: ORAL_TABLET | ORAL | 0 refills | Status: DC

## 2019-07-28 NOTE — Telephone Encounter (Signed)
Rx sent to pharmacy pt notified of update. 

## 2019-10-19 ENCOUNTER — Other Ambulatory Visit: Payer: Self-pay

## 2019-10-20 ENCOUNTER — Ambulatory Visit (INDEPENDENT_AMBULATORY_CARE_PROVIDER_SITE_OTHER): Payer: 59 | Admitting: Family Medicine

## 2019-10-20 ENCOUNTER — Encounter: Payer: Self-pay | Admitting: Family Medicine

## 2019-10-20 VITALS — BP 140/80 | HR 85 | Temp 98.0°F | Ht 66.0 in | Wt 229.0 lb

## 2019-10-20 DIAGNOSIS — J45990 Exercise induced bronchospasm: Secondary | ICD-10-CM | POA: Diagnosis not present

## 2019-10-20 DIAGNOSIS — Z0000 Encounter for general adult medical examination without abnormal findings: Secondary | ICD-10-CM | POA: Diagnosis not present

## 2019-10-20 LAB — CBC WITH DIFFERENTIAL/PLATELET
Basophils Absolute: 0 10*3/uL (ref 0.0–0.1)
Basophils Relative: 0.6 % (ref 0.0–3.0)
Eosinophils Absolute: 0.3 10*3/uL (ref 0.0–0.7)
Eosinophils Relative: 4.1 % (ref 0.0–5.0)
HCT: 42.7 % (ref 39.0–52.0)
Hemoglobin: 14 g/dL (ref 13.0–17.0)
Lymphocytes Relative: 25.1 % (ref 12.0–46.0)
Lymphs Abs: 1.6 10*3/uL (ref 0.7–4.0)
MCHC: 32.8 g/dL (ref 30.0–36.0)
MCV: 87.3 fl (ref 78.0–100.0)
Monocytes Absolute: 0.4 10*3/uL (ref 0.1–1.0)
Monocytes Relative: 6.2 % (ref 3.0–12.0)
Neutro Abs: 3.9 10*3/uL (ref 1.4–7.7)
Neutrophils Relative %: 64 % (ref 43.0–77.0)
Platelets: 266 10*3/uL (ref 150.0–400.0)
RBC: 4.9 Mil/uL (ref 4.22–5.81)
RDW: 14.2 % (ref 11.5–15.5)
WBC: 6.2 10*3/uL (ref 4.0–10.5)

## 2019-10-20 LAB — LIPID PANEL
Cholesterol: 169 mg/dL (ref 0–200)
HDL: 42 mg/dL (ref >39.00)
LDL Cholesterol: 101 mg/dL — ABNORMAL HIGH (ref 0–99)
NonHDL: 127.19
Total CHOL/HDL Ratio: 4
Triglycerides: 129 mg/dL (ref 0.0–149.0)
VLDL: 25.8 mg/dL (ref 0.0–40.0)

## 2019-10-20 LAB — HEPATIC FUNCTION PANEL
ALT: 31 U/L (ref 0–53)
AST: 23 U/L (ref 0–37)
Albumin: 4.5 g/dL (ref 3.5–5.2)
Alkaline Phosphatase: 40 U/L (ref 39–117)
Bilirubin, Direct: 0.1 mg/dL (ref 0.0–0.3)
Total Bilirubin: 0.7 mg/dL (ref 0.2–1.2)
Total Protein: 6.9 g/dL (ref 6.0–8.3)

## 2019-10-20 LAB — BASIC METABOLIC PANEL
BUN: 11 mg/dL (ref 6–23)
CO2: 31 mEq/L (ref 19–32)
Calcium: 9.7 mg/dL (ref 8.4–10.5)
Chloride: 104 mEq/L (ref 96–112)
Creatinine, Ser: 1.28 mg/dL (ref 0.40–1.50)
GFR: 74.92 mL/min (ref >60.00)
Glucose, Bld: 95 mg/dL (ref 70–99)
Potassium: 4 mEq/L (ref 3.5–5.1)
Sodium: 142 mEq/L (ref 135–145)

## 2019-10-20 LAB — PSA: PSA: 0.41 ng/mL (ref 0.10–4.00)

## 2019-10-20 LAB — TSH: TSH: 1.52 u[IU]/mL (ref 0.35–4.50)

## 2019-10-20 MED ORDER — LORATADINE 10 MG PO TABS: 10.0000 mg | ORAL_TABLET | Freq: Every day | ORAL | 11 refills | Status: AC

## 2019-10-20 MED ORDER — PANTOPRAZOLE SODIUM 40 MG PO TBEC: 40.0000 mg | DELAYED_RELEASE_TABLET | Freq: Every day | ORAL | 3 refills | Status: DC

## 2019-10-20 NOTE — Progress Notes (Signed)
Subjective:    Patient ID: Brent Watson, male    DOB: [birth year], 43 y.o.   MRN: [id number]  HPI Here for a well exam. He feels fine. He has developed some exercise induced asthma which was bothering him during long bicycle rides. Now he uses his inhaler before a ride, and he has no trouble at all.     Review of Systems  Constitutional: Negative.   HENT: Negative.   Eyes: Negative.   Respiratory: Negative.   Cardiovascular: Negative.   Gastrointestinal: Negative.   Genitourinary: Negative.   Musculoskeletal: Negative.   Skin: Negative.   Neurological: Negative.   Psychiatric/Behavioral: Negative.        Objective:   Physical Exam Constitutional:      General: He is not in acute distress.    Appearance: He is well-developed. He is not diaphoretic.  HENT:     Head: Normocephalic and atraumatic.     Right Ear: External ear normal.     Left Ear: External ear normal.     Nose: Nose normal.     Mouth/Throat:     Pharynx: No oropharyngeal exudate.  Eyes:     General: No scleral icterus.       Right eye: No discharge.        Left eye: No discharge.     Conjunctiva/sclera: Conjunctivae normal.     Pupils: Pupils are equal, round, and reactive to light.  Neck:     Musculoskeletal: Neck supple.     Thyroid: No thyromegaly.     Vascular: No JVD.     Trachea: No tracheal deviation.  Cardiovascular:     Rate and Rhythm: Normal rate and regular rhythm.     Heart sounds: Normal heart sounds. No murmur. No friction rub. No gallop.   Pulmonary:     Effort: Pulmonary effort is normal. No respiratory distress.     Breath sounds: Normal breath sounds. No wheezing or rales.  Chest:     Chest wall: No tenderness.  Abdominal:     General: Bowel sounds are normal. There is no distension.     Palpations: Abdomen is soft. There is no mass.     Tenderness: There is no abdominal tenderness. There is no guarding or rebound.  Genitourinary:    Penis: Normal. No tenderness.    Scrotum/Testes: Normal.     Prostate: Normal.     Rectum: Normal. Guaiac result negative.     Comments: His left sided hydrocele is stable  Musculoskeletal: Normal range of motion.        General: No tenderness.  Lymphadenopathy:     Cervical: No cervical adenopathy.  Skin:    General: Skin is warm and dry.     Coloration: Skin is not pale.     Findings: No erythema or rash.  Neurological:     Mental Status: He is alert and oriented to person, place, and time.     Cranial Nerves: No cranial nerve deficit.     Motor: No abnormal muscle tone.     Coordination: Coordination normal.     Deep Tendon Reflexes: Reflexes are normal and symmetric. Reflexes normal.  Psychiatric:        Behavior: Behavior normal.        Thought Content: Thought content normal.        Judgment: Judgment normal.           Assessment & Plan:  Well exam. We discussed diet and exercise. Get fasting labs.  Alysia Penna, MD

## 2020-04-12 ENCOUNTER — Ambulatory Visit: Payer: 59 | Admitting: Family Medicine

## 2020-05-10 ENCOUNTER — Other Ambulatory Visit: Payer: Self-pay

## 2020-05-10 ENCOUNTER — Telehealth (INDEPENDENT_AMBULATORY_CARE_PROVIDER_SITE_OTHER): Payer: 59 | Admitting: Family Medicine

## 2020-05-10 DIAGNOSIS — R42 Dizziness and giddiness: Secondary | ICD-10-CM | POA: Diagnosis not present

## 2020-05-10 DIAGNOSIS — R110 Nausea: Secondary | ICD-10-CM

## 2020-05-10 NOTE — Progress Notes (Signed)
Virtual Visit via Telephone Note  I connected with [name] Kitchen on 05/10/20 at 12:40 PM EDT by telephone and verified that I am speaking with the correct person using two identifiers.   I discussed the limitations, risks, security and privacy concerns of performing an evaluation and management service by telephone and the availability of in person appointments. I also discussed with the patient that there may be a patient responsible charge related to this service. The patient expressed understanding and agreed to proceed.  Location patient: home Location provider: work or home office Participants present for the call: patient, provider Patient did not have a visit in the prior 7 days to address this/these issue(s).   History of Present Illness:  Acute visit for feeling unwell, nausea: -reports started acutely at 4 am this morning -woke up and when looked to the L felt suddenly nauseous and dizzy, if put his head straight would resolve  -he was able to drink fluids and lie in one position and this helped the symptoms -has PMH GERD and seasonal allergies -denies fever, diarrhea, vomiting, body aches, resp symptoms, HA, vision changes -has had mild vertigo in the past -reports he is feeling much better than he was earlier as long as he is careful how he moves and after lying in a comfortable R sided position for awhile -mild symptoms still if turns head to the L   Observations/Objective: Patient sounds cheerful and well on the phone. I do not appreciate any SOB. Speech and thought processing are grossly intact. Patient reported vitals:  Assessment and Plan:  Vertigo  Nausea  -we discussed possible serious and likely etiologies, options for evaluation and workup, limitations of telemedicine visit vs in person visit, treatment, treatment risks and precautions. Pt prefers to treat via telemedicine empirically rather then risking or undertaking an in person visit at this moment.  Discussed possibility of BPPV given classic positional related vertigo. Discussed other causes as well, some serious. He opted to try home video treatment for positional vertigo and agrees to seek prompt in person care if  worsening, new symptoms arise, or if is not improving with treatment. He was advised to not drive with vertigo.   Follow Up Instructions:  I did not refer this patient for an OV in the next 24 hours for this/these issue(s).  I discussed the assessment and treatment plan with the patient. The patient was provided an opportunity to ask questions and all were answered. The patient agreed with the plan and demonstrated an understanding of the instructions.   The patient was advised to call back or seek an in-person evaluation if the symptoms worsen or if the condition fails to improve as anticipated.  I provided 15 minutes of non-face-to-face time during this encounter.   Terressa Koyanagi, DO

## 2020-05-23 ENCOUNTER — Ambulatory Visit: Payer: 59 | Admitting: Family Medicine

## 2020-08-06 ENCOUNTER — Other Ambulatory Visit: Payer: Self-pay | Admitting: Family Medicine

## 2020-10-31 ENCOUNTER — Encounter: Payer: Self-pay | Admitting: Family Medicine

## 2020-11-07 ENCOUNTER — Encounter: Payer: Self-pay | Admitting: Family Medicine

## 2020-11-07 ENCOUNTER — Other Ambulatory Visit: Payer: Self-pay

## 2020-11-07 ENCOUNTER — Ambulatory Visit (INDEPENDENT_AMBULATORY_CARE_PROVIDER_SITE_OTHER): Payer: 59 | Admitting: Family Medicine

## 2020-11-07 VITALS — BP 132/60 | HR 64 | Ht 66.0 in | Wt 230.0 lb

## 2020-11-07 DIAGNOSIS — Z0000 Encounter for general adult medical examination without abnormal findings: Secondary | ICD-10-CM

## 2020-11-07 LAB — BASIC METABOLIC PANEL WITH GFR
BUN: 16 mg/dL (ref 7–25)
CO2: 29 mmol/L (ref 20–32)
Calcium: 9.9 mg/dL (ref 8.6–10.3)
Chloride: 102 mmol/L (ref 98–110)
Creat: 1.35 mg/dL (ref 0.60–1.35)
GFR, Est African American: 75 mL/min/{1.73_m2} (ref >=60)
GFR, Est Non African American: 64 mL/min/{1.73_m2} (ref >=60)
Glucose, Bld: 85 mg/dL (ref 65–99)
Potassium: 3.9 mmol/L (ref 3.5–5.3)
Sodium: 139 mmol/L (ref 135–146)

## 2020-11-07 MED ORDER — ATORVASTATIN CALCIUM 20 MG PO TABS
ORAL_TABLET | ORAL | 3 refills | Status: DC
Start: 2020-11-07 — End: 2021-11-08

## 2020-11-07 MED ORDER — PANTOPRAZOLE SODIUM 40 MG PO TBEC
40.0000 mg | DELAYED_RELEASE_TABLET | Freq: Every day | ORAL | 3 refills | Status: DC
Start: 2020-11-07 — End: 2021-11-08

## 2020-11-07 NOTE — Progress Notes (Signed)
Subjective:    Patient ID: Brent Watson, male    DOB: 07/14/1976, 44 y.o.   MRN: [id number]  HPI Here for a well exam. He feels fine.    Review of Systems  Constitutional: Negative.   HENT: Negative.   Eyes: Negative.   Respiratory: Negative.   Cardiovascular: Negative.   Gastrointestinal: Negative.   Genitourinary: Negative.   Musculoskeletal: Negative.   Skin: Negative.   Neurological: Negative.   Psychiatric/Behavioral: Negative.        Objective:   Physical Exam Constitutional:      General: He is not in acute distress.    Appearance: He is well-developed and well-nourished. He is not diaphoretic.  HENT:     Head: Normocephalic and atraumatic.     Right Ear: External ear normal.     Left Ear: External ear normal.     Nose: Nose normal.     Mouth/Throat:     Mouth: Oropharynx is clear and moist.     Pharynx: No oropharyngeal exudate.  Eyes:     General: No scleral icterus.       Right eye: No discharge.        Left eye: No discharge.     Extraocular Movements: EOM normal.     Conjunctiva/sclera: Conjunctivae normal.     Pupils: Pupils are equal, round, and reactive to light.  Neck:     Thyroid: No thyromegaly.     Vascular: No JVD.     Trachea: No tracheal deviation.  Cardiovascular:     Rate and Rhythm: Normal rate and regular rhythm.     Pulses: Intact distal pulses.     Heart sounds: Normal heart sounds. No murmur heard. No friction rub. No gallop.   Pulmonary:     Effort: Pulmonary effort is normal. No respiratory distress.     Breath sounds: Normal breath sounds. No wheezing or rales.  Chest:     Chest wall: No tenderness.  Abdominal:     General: Bowel sounds are normal. There is no distension.     Palpations: Abdomen is soft. There is no mass.     Tenderness: There is no abdominal tenderness. There is no guarding or rebound.  Genitourinary:    Penis: Normal. No tenderness.      Testes: Normal.     Prostate: Normal.     Rectum: Normal.  Guaiac result negative.  Musculoskeletal:        General: No tenderness or edema. Normal range of motion.     Cervical back: Neck supple.  Lymphadenopathy:     Cervical: No cervical adenopathy.  Skin:    General: Skin is warm and dry.     Coloration: Skin is not pale.     Findings: No erythema or rash.  Neurological:     Mental Status: He is alert and oriented to person, place, and time.     Cranial Nerves: No cranial nerve deficit.     Motor: No abnormal muscle tone.     Coordination: Coordination normal.     Deep Tendon Reflexes: Reflexes are normal and symmetric. Reflexes normal.  Psychiatric:        Mood and Affect: Mood and affect normal.        Behavior: Behavior normal.        Thought Content: Thought content normal.        Judgment: Judgment normal.           Assessment & Plan:  Well exam. We  discussed diet and exercise. Get fasting labs.  Alysia Penna, MD

## 2020-11-08 LAB — HEPATIC FUNCTION PANEL
ALT: 28 U/L (ref 0–53)
AST: 22 U/L (ref 0–37)
Albumin: 4.6 g/dL (ref 3.5–5.2)
Alkaline Phosphatase: 41 U/L (ref 39–117)
Bilirubin, Direct: 0.1 mg/dL (ref 0.0–0.3)
Total Bilirubin: 0.5 mg/dL (ref 0.2–1.2)
Total Protein: 7.4 g/dL (ref 6.0–8.3)

## 2020-11-08 LAB — CBC WITH DIFFERENTIAL/PLATELET
Basophils Absolute: 0 10*3/uL (ref 0.0–0.1)
Basophils Relative: 0.8 % (ref 0.0–3.0)
Eosinophils Absolute: 0.1 10*3/uL (ref 0.0–0.7)
Eosinophils Relative: 2.5 % (ref 0.0–5.0)
HCT: 42.7 % (ref 39.0–52.0)
Hemoglobin: 14.1 g/dL (ref 13.0–17.0)
Lymphocytes Relative: 29.1 % (ref 12.0–46.0)
Lymphs Abs: 1.6 10*3/uL (ref 0.7–4.0)
MCHC: 33 g/dL (ref 30.0–36.0)
MCV: 87.2 fl (ref 78.0–100.0)
Monocytes Absolute: 0.6 10*3/uL (ref 0.1–1.0)
Monocytes Relative: 11.2 % (ref 3.0–12.0)
Neutro Abs: 3.1 10*3/uL (ref 1.4–7.7)
Neutrophils Relative %: 56.4 % (ref 43.0–77.0)
Platelets: 240 10*3/uL (ref 150.0–400.0)
RBC: 4.89 Mil/uL (ref 4.22–5.81)
RDW: 14.3 % (ref 11.5–15.5)
WBC: 5.5 10*3/uL (ref 4.0–10.5)

## 2020-11-08 LAB — LIPID PANEL
Cholesterol: 171 mg/dL (ref 0–200)
HDL: 37.6 mg/dL — ABNORMAL LOW (ref >39.00)
LDL Cholesterol: 110 mg/dL — ABNORMAL HIGH (ref 0–99)
NonHDL: 133.3
Total CHOL/HDL Ratio: 5
Triglycerides: 119 mg/dL (ref 0.0–149.0)
VLDL: 23.8 mg/dL (ref 0.0–40.0)

## 2020-11-08 LAB — PSA: PSA: 0.31 ng/mL (ref 0.10–4.00)

## 2020-11-08 LAB — TSH: TSH: 1.49 u[IU]/mL (ref 0.35–4.50)

## 2021-03-27 ENCOUNTER — Emergency Department (HOSPITAL_COMMUNITY)
Admission: EM | Admit: 2021-03-27 | Discharge: 2021-03-27 | Disposition: A | Discharge status: Home / Self Care | Payer: 59 | Attending: Emergency Medicine | Admitting: Emergency Medicine

## 2021-03-27 ENCOUNTER — Encounter (HOSPITAL_COMMUNITY): Payer: Self-pay | Admitting: *Deleted

## 2021-03-27 ENCOUNTER — Emergency Department (HOSPITAL_COMMUNITY): Payer: 59

## 2021-03-27 ENCOUNTER — Other Ambulatory Visit: Payer: Self-pay

## 2021-03-27 DIAGNOSIS — M79622 Pain in left upper arm: Secondary | ICD-10-CM | POA: Insufficient documentation

## 2021-03-27 DIAGNOSIS — J45990 Exercise induced bronchospasm: Secondary | ICD-10-CM | POA: Insufficient documentation

## 2021-03-27 DIAGNOSIS — X58XXXA Exposure to other specified factors, initial encounter: Secondary | ICD-10-CM | POA: Diagnosis not present

## 2021-03-27 DIAGNOSIS — S29011A Strain of muscle and tendon of front wall of thorax, initial encounter: Secondary | ICD-10-CM | POA: Insufficient documentation

## 2021-03-27 DIAGNOSIS — T148XXA Other injury of unspecified body region, initial encounter: Secondary | ICD-10-CM

## 2021-03-27 DIAGNOSIS — S299XXA Unspecified injury of thorax, initial encounter: Secondary | ICD-10-CM | POA: Diagnosis present

## 2021-03-27 DIAGNOSIS — R0789 Other chest pain: Secondary | ICD-10-CM

## 2021-03-27 LAB — CBC WITH DIFFERENTIAL/PLATELET
Abs Immature Granulocytes: 0.02 10*3/uL (ref 0.00–0.07)
Basophils Absolute: 0 10*3/uL (ref 0.0–0.1)
Basophils Relative: 1 %
Eosinophils Absolute: 0.2 10*3/uL (ref 0.0–0.5)
Eosinophils Relative: 3 %
HCT: 43 % (ref 39.0–52.0)
Hemoglobin: 13.6 g/dL (ref 13.0–17.0)
Immature Granulocytes: 0 %
Lymphocytes Relative: 24 %
Lymphs Abs: 1.4 10*3/uL (ref 0.7–4.0)
MCH: 28.3 pg (ref 26.0–34.0)
MCHC: 31.6 g/dL (ref 30.0–36.0)
MCV: 89.6 fL (ref 80.0–100.0)
Monocytes Absolute: 0.5 10*3/uL (ref 0.1–1.0)
Monocytes Relative: 9 %
Neutro Abs: 3.8 10*3/uL (ref 1.7–7.7)
Neutrophils Relative %: 63 %
Platelets: 275 10*3/uL (ref 150–400)
RBC: 4.8 MIL/uL (ref 4.22–5.81)
RDW: 13.7 % (ref 11.5–15.5)
WBC: 6 10*3/uL (ref 4.0–10.5)
nRBC: 0 % (ref 0.0–0.2)

## 2021-03-27 LAB — BASIC METABOLIC PANEL
Anion gap: 4 — ABNORMAL LOW (ref 5–15)
BUN: 10 mg/dL (ref 6–20)
CO2: 28 mmol/L (ref 22–32)
Calcium: 9.5 mg/dL (ref 8.9–10.3)
Chloride: 107 mmol/L (ref 98–111)
Creatinine, Ser: 1.3 mg/dL — ABNORMAL HIGH (ref 0.61–1.24)
GFR, Estimated: >60 mL/min (ref >60)
Glucose, Bld: 104 mg/dL — ABNORMAL HIGH (ref 70–99)
Potassium: 4.3 mmol/L (ref 3.5–5.1)
Sodium: 139 mmol/L (ref 135–145)

## 2021-03-27 LAB — TROPONIN I (HIGH SENSITIVITY)
Troponin I (High Sensitivity): 3 ng/L (ref <18)
Troponin I (High Sensitivity): <2 ng/L (ref <18)

## 2021-03-27 NOTE — ED Provider Notes (Signed)
[facility] EMERGENCY DEPARTMENT Provider Note   CSN: [id number] Arrival date & time: 03/27/21  1054     History Chief Complaint  Patient presents with  . Chest Pain    Brent Watson is a 45 y.o. male who presents concern for left-sided chest pain that extends into his axilla that is been intermittent x3 weeks.  It is worse when he is lying on his left side and exacerbated by movement.  There is no associated shortness of breath, it is improved with exertion, and there are no palpitations.  Patient states he has been working out much more regularly in an attempt to lose weight.  However the pain woke him this morning around 5 AM and has been consistent or it is normally intermittent.  For that reason he presented to the emergency department.  No history of DVT, recent travel by plane to [place] 1 month ago, however no other hormone replacement, recent surgeries or prolonged immobilization.  I personally reviewed this patient's medical records.  He has history of hyperlipidemia and GERD.  HPI     Past Medical History:  Diagnosis Date  . Allergy   . GERD (gastroesophageal reflux disease)   . Hyperlipidemia     Patient Active Problem List   Diagnosis Date Noted  . Exercise-induced asthma 10/20/2019  . OTHER SPECIFIED DISORDER OF MALE GENITAL ORGANS 05/08/2010  . GERD 04/21/2009  . HYDROCELE 04/21/2009  . SLEEP APNEA 04/21/2009  . Allergic rhinitis 10/06/2007  . RENAL CALCULUS, HX OF 10/06/2007  . HYPERLIPIDEMIA 09/02/2007    Past Surgical History:  Procedure Laterality Date  . bilateral  inguinal hernia repair    . HYDROCELE EXCISION Left 06/10/2015   Procedure: LEFT HYDROCELECTOMY ADULT;  Surgeon: Su Grand, MD;  Location: [facility];  Service: Urology;  Laterality: Left;  [name] Kitchen VASECTOMY Bilateral 06/10/2015   Procedure: BILATERAL VASECTOMY;  Surgeon: Su Grand, MD;  Location: [facility];  Service: Urology;  Laterality:  Bilateral;       Family History  Problem Relation Age of Onset  . Sleep apnea Brother   . Diabetes Other   . Hyperlipidemia Other   . Hypertension Other     Social History   Tobacco Use  . Smoking status: Never Smoker  . Smokeless tobacco: Never Used  Substance Use Topics  . Alcohol use: Yes    Alcohol/week: 0.0 standard drinks    Comment: occ  . Drug use: No    Home Medications Prior to Admission medications   Medication Sig Start Date End Date Taking? Authorizing Provider  albuterol (VENTOLIN HFA) 108 (90 Base) MCG/ACT inhaler INHALE 2 PUFFS INTO THE LUNGS EVERY 4 (FOUR) HOURS AS NEEDED FOR WHEEZING OR SHORTNESS OF BREATH. 08/08/20   Nelwyn Salisbury, MD  atorvastatin (LIPITOR) 20 MG tablet Daily 11/07/20   Nelwyn Salisbury, MD  loratadine (CLARITIN) 10 MG tablet Take 1 tablet (10 mg total) by mouth daily. 10/20/19   Nelwyn Salisbury, MD  Multiple Vitamin (MULTIVITAMIN) tablet Take 1 tablet by mouth daily. Reported on 02/14/2016    [provider]  pantoprazole (PROTONIX) 40 MG tablet Take 1 tablet (40 mg total) by mouth daily. 11/07/20   Nelwyn Salisbury, MD    Allergies    Patient has no known allergies.  Review of Systems   Review of Systems  Constitutional: Negative.   HENT: Negative.   Respiratory: Negative.  Negative for shortness of breath.   Cardiovascular: Positive for  chest pain. Negative for palpitations.  Gastrointestinal: Negative.   Genitourinary: Negative.   Musculoskeletal: Negative.   Allergic/Immunologic: Negative.   Hematological: Negative.     Physical Exam Updated Vital Signs BP 116/82 (BP Location: Right Arm)   Pulse 85   Temp 98.2 F (36.8 C) (Oral)   Resp 16   SpO2 98%   Physical Exam Vitals and nursing note reviewed.  Constitutional:      Appearance: He is obese. He is not ill-appearing or toxic-appearing.  HENT:     Head: Normocephalic and atraumatic.     Mouth/Throat:     Mouth: Mucous membranes are moist.     Pharynx:  Oropharynx is clear. Uvula midline. No oropharyngeal exudate or posterior oropharyngeal erythema.  Eyes:     General: Lids are normal. Vision grossly intact.        Right eye: No discharge.        Left eye: No discharge.     Extraocular Movements: Extraocular movements intact.     Conjunctiva/sclera: Conjunctivae normal.  Neck:     Trachea: Trachea and phonation normal.  Cardiovascular:     Rate and Rhythm: Normal rate and regular rhythm.     Pulses: Normal pulses.     Heart sounds: Normal heart sounds. No murmur heard.   Pulmonary:     Effort: Pulmonary effort is normal. No tachypnea, bradypnea, accessory muscle usage, prolonged expiration or respiratory distress.     Breath sounds: Normal breath sounds. No wheezing or rales.  Chest:     Chest wall: Tenderness present. No mass, lacerations, deformity, swelling, crepitus or edema.    Abdominal:     General: Bowel sounds are normal. There is no distension.     Palpations: Abdomen is soft.     Tenderness: There is no abdominal tenderness. There is no right CVA tenderness, left CVA tenderness, guarding or rebound.  Musculoskeletal:        General: No deformity.     Cervical back: Normal range of motion and neck supple. No edema, rigidity or crepitus. No pain with movement, spinous process tenderness or muscular tenderness.     Right lower leg: No edema.     Left lower leg: No edema.  Lymphadenopathy:     Cervical: No cervical adenopathy.  Skin:    General: Skin is warm and dry.  Neurological:     Mental Status: He is alert and oriented to person, place, and time. Mental status is at baseline.     Gait: Gait is intact.  Psychiatric:        Mood and Affect: Mood normal.     ED Results / Procedures / Treatments   Labs (all labs ordered are listed, but only abnormal results are displayed) Labs Reviewed  BASIC METABOLIC PANEL - Abnormal; Notable for the following components:      Result Value   Glucose, Bld 104 (*)     Creatinine, Ser 1.30 (*)    Anion gap 4 (*)    All other components within normal limits  CBC WITH DIFFERENTIAL/PLATELET  TROPONIN I (HIGH SENSITIVITY)  TROPONIN I (HIGH SENSITIVITY)    EKG EKG Interpretation  Date/Time:  Monday Mar 27 2021 16:07:33 EDT Ventricular Rate:  83 PR Interval:  135 QRS Duration: 77 QT Interval:  356 QTC Calculation: 419 R Axis:   81 Text Interpretation: Sinus rhythm Borderline repolarization abnormality Minimal ST elevation, lateral leads Confirmed by Kristine Royal [phone]) on 03/27/2021 4:09:24 PM   Radiology DG Chest 2  View  Result Date: 03/27/2021 CLINICAL DATA:  Chest pain and shortness of breath EXAM: CHEST - 2 VIEW COMPARISON:  Two-view chest radiograph 01/07/2005 FINDINGS: The heart size and mediastinal contours are within normal limits. No focal airspace disease. No pleural effusion or pneumothorax. The visualized skeletal structures are unremarkable. IMPRESSION: No evidence of acute cardiopulmonary disease. Electronically Signed   By: Caprice Renshaw   On: 03/27/2021 12:27    Procedures Procedures   Medications Ordered in ED Medications - No data to display  ED Course  I have reviewed the triage vital signs and the nursing notes.  Pertinent labs & imaging results that were available during my care of the patient were reviewed by me and considered in my medical decision making (see chart for details).    MDM Rules/Calculators/A&P                         45 year old male presents with concern for left-sided chest pain that is nonexertional in nature intermittent x3 weeks, present since 5:00 this morning.  No associated shortness of breath.  Differential diagnosis includes but is limited to ACS, arrhythmia, PE, musculoskeletal injury, pleural effusion, pneumonia, herpes zoster.  Hypertensive and tachycardic on intake.  Vital signs are stable.  Cardiopulmonary exam is normal at time of my exam and abdominal exam is benign.  Patient is  neurovascularly intact in all 4 extremities.  No skin changes.  There is tenderness palpation of the left pectoralis muscle and into the left axilla, soft tissue without skin changes, bruising, or erythema.  CBC is unremarkable,  BMP with creatinine of 1.3, patient baseline.  Initial troponin negative, 3.  Delta troponin also negative, less than 2.  EKG on intake was concerning for T wave inversion in lead III, no STEMI.  Patient reevaluated with persistent left-sided chest wall pain, exacerbated with movement, vital signs within normal patient denies any shortness of breath.  HPI and physical exam as well as laboratory studies inconsistent with ACS at this time.  Repeat EKG with pain in setting of T wave change in lead III, without dynamic change.  This is reassuring.  Presentation most consistent with musculoskeletal injury, which is consistent with patient's recent history of increased exercise activity in effort to lose weight.  Will still provide cardiology follow-up given T wave changes and comorbidities including obesity, hyperglycemia, and borderline hypertension.  Recommend NSAID use and heat or ice for management of left chest wall pain.  No further work-up warranted in ED at this time.  Brent Watson voiced understanding of his medical evaluation and treatment plan.  Each of his questions was answered to his expressed satisfaction.  Strict return precautions given.  Patient is well-appearing, stable, and appropriate for discharge at this time.   This chart was dictated using voice recognition software, Dragon. Despite the best efforts of this provider to proofread and correct errors, errors may still occur which can change documentation meaning.  Final Clinical Impression(s) / ED Diagnoses Final diagnoses:  Atypical chest pain  Muscle strain    Rx / DC Orders ED Discharge Orders         Ordered    Ambulatory referral to Cardiology        03/27/21 [address] Idelia Salm 03/27/21 1745    Milagros Loll, MD 03/28/21 1003

## 2021-03-27 NOTE — ED Provider Notes (Signed)
Emergency Medicine Provider Triage Evaluation Note  Brent Watson , a 45 y.o. male  was evaluated in triage.  Pt complains of central to left sided chest pain, worse with lying on the left side. Not exertional in nature. Does not radiate. No SOB. Intermittent x 3 weeks, self resolving. Been persistent since 5 am. Flew to [place] last month, no other recent travel. No hx of DVT.  Review of Systems  Positive: CP, palpitations Negative: SOB, syncope, calf pain, swelling  Physical Exam  BP (!) 146/102 (BP Location: Left Arm)   Pulse (!) 111   Temp 97.9 F (36.6 C) (Oral)   Resp 16   SpO2 100%  Gen:   Awake, no distress   Resp:  Normal effort  MSK:   Moves extremities without difficulty  Other:  Tachycardic, normal rhythm  Medical Decision Making  Medically screening exam initiated at 11:27 AM.  Appropriate orders placed.  [name] Kitchen was informed that the remainder of the evaluation will be completed by another provider, this initial triage assessment does not replace that evaluation, and the importance of remaining in the ED until their evaluation is complete.  This chart was dictated using voice recognition software, Dragon. Despite the best efforts of this provider to proofread and correct errors, errors may still occur which can change documentation meaning.    Sherrilee Gilles 03/27/21 1133    Milagros Loll, MD 03/28/21 1140

## 2021-03-27 NOTE — Discharge Instructions (Addendum)
You were seen in the emergency department today for your chest pain.  Your physical exam and vital signs are very reassuring.  Your blood work was very reassuring.  You did have an abnormality on EKG, however given reassuring blood work no need for you to remain in the emergency department at this time.  Please follow-up with the cardiologist listed below.  You have been given an ambulatory referral to their office as well.   I suspect that you have a muscle strain of the muscle of your left chest wall. This can be quite painful. To help with your pain you may take Tylenol and / or NSAID medication (such as ibuprofen or naproxen) to help with your pain.   You may also utilize topical pain relief such as Biofreeze, IcyHot, or topical lidocaine patches.  I also recommend that you apply heat to the area, such as a hot shower or heating pad, and follow heat application with massage of the muscles that are most tight.  Please return to the emergency department if you develop any numbness/tingling/weakness in your arms or legs, any difficulty urinating, or urinary incontinence chest pain, shortness of breath, abdominal pain, nausea or vomiting that does not stop, or any other new severe symptoms.

## 2021-03-27 NOTE — ED Triage Notes (Signed)
Pt reports having left side chest pain for several days, increases when lying down. Denies cardiac hx.

## 2021-04-06 ENCOUNTER — Encounter: Payer: Self-pay | Admitting: Cardiovascular Disease

## 2021-04-06 ENCOUNTER — Other Ambulatory Visit: Payer: Self-pay

## 2021-04-06 ENCOUNTER — Ambulatory Visit: Payer: 59 | Admitting: Cardiovascular Disease

## 2021-04-06 DIAGNOSIS — R0789 Other chest pain: Secondary | ICD-10-CM

## 2021-04-06 NOTE — Assessment & Plan Note (Signed)
History of hyperlipidemia on statin therapy with lipid profile performed 11/07/2020 revealing total cholesterol 171, LDL 110 and HDL of 37.

## 2021-04-06 NOTE — Assessment & Plan Note (Signed)
BMI close to 40.  In the process of trying to lose weight.

## 2021-04-06 NOTE — Patient Instructions (Signed)
Medication Instructions:  Your physician recommends that you continue on your current medications as directed. Please refer to the Current Medication list given to you today.  *If you need a refill on your cardiac medications before your next appointment, please call your pharmacy*   Testing/Procedures: Your physician has requested that you have an exercise tolerance test. For further information please visit [email or web address] Please also follow instruction sheet, as given.  Dr. Allyson Sabal has ordered a CT coronary calcium score. This test is done at [address]. This is $99 out of pocket.   Coronary CalciumScan A coronary calcium scan is an imaging test used to look for deposits of calcium and other fatty materials (plaques) in the inner lining of the blood vessels of the heart (coronary arteries). These deposits of calcium and plaques can partly clog and narrow the coronary arteries without producing any symptoms or warning signs. This puts a person at risk for a heart attack. This test can detect these deposits before symptoms develop. Tell a health care provider about:  Any allergies you have.  All medicines you are taking, including vitamins, herbs, eye drops, creams, and over-the-counter medicines.  Any problems you or family members have had with anesthetic medicines.  Any blood disorders you have.  Any surgeries you have had.  Any medical conditions you have.  Whether you are pregnant or may be pregnant. What are the risks? Generally, this is a safe procedure. However, problems may occur, including:  Harm to a pregnant woman and her unborn baby. This test involves the use of radiation. Radiation exposure can be dangerous to a pregnant woman and her unborn baby. If you are pregnant, you generally should not have this procedure done.  Slight increase in the risk of cancer. This is because of the radiation involved in the test. What happens before the  procedure? No preparation is needed for this procedure. What happens during the procedure?  You will undress and remove any jewelry around your neck or chest.  You will put on a hospital gown.  Sticky electrodes will be placed on your chest. The electrodes will be connected to an electrocardiogram (ECG) machine to record a tracing of the electrical activity of your heart.  A CT scanner will take pictures of your heart. During this time, you will be asked to lie still and hold your breath for 2-3 seconds while a picture of your heart is being taken. The procedure may vary among health care providers and hospitals. What happens after the procedure?  You can get dressed.  You can return to your normal activities.  It is up to you to get the results of your test. Ask your health care provider, or the department that is doing the test, when your results will be ready. Summary  A coronary calcium scan is an imaging test used to look for deposits of calcium and other fatty materials (plaques) in the inner lining of the blood vessels of the heart (coronary arteries).  Generally, this is a safe procedure. Tell your health care provider if you are pregnant or may be pregnant.  No preparation is needed for this procedure.  A CT scanner will take pictures of your heart.  You can return to your normal activities after the scan is done. This information is not intended to replace advice given to you by your health care provider. Make sure you discuss any questions you have with your health care provider. Document Released: 04/26/2008 Document Revised:  09/17/2016 Document Reviewed: 09/17/2016 Elsevier Interactive Patient Education  2017 [employer].     Follow-Up: At [facility], you and your health needs are our priority.  As part of our continuing mission to provide you with exceptional heart care, we have created designated Provider Care Teams.  These Care Teams include your primary  Cardiologist (physician) and Advanced Practice Providers (APPs -  Physician Assistants and Nurse Practitioners) who all work together to provide you with the care you need, when you need it.  We recommend signing up for the patient portal called "MyChart".  Sign up information is provided on this After Visit Summary.  MyChart is used to connect with patients for Virtual Visits (Telemedicine).  Patients are able to view lab/test results, encounter notes, upcoming appointments, etc.  Non-urgent messages can be sent to your provider as well.   To learn more about what you can do with MyChart, go to [email or web address].    Your next appointment:   No future appointments made at this time. We will see you on an as needed basis.  Provider:   Nanetta Batty, MD

## 2021-04-06 NOTE — Addendum Note (Signed)
Addended by: Bernita Buffy on: 04/06/2021 10:24 AM   Modules accepted: Orders

## 2021-04-06 NOTE — Addendum Note (Signed)
Addended by: Runell Gess on: 04/06/2021 10:29 AM   Modules accepted: Orders

## 2021-04-06 NOTE — Progress Notes (Signed)
04/06/2021 Brent Watson   [birth year]  [id number]  Primary Physician Brent Salisbury, MD Primary Cardiologist: Brent Gess MD Brent Watson, [place]  HPI:  Brent Watson is a 45 y.o. severely overweight married African-American male father of 3 children who is a Merchandiser, retail at [employer] who was referred by his PCP, Dr. Larey Watson, for evaluation of atypical chest pain.  His only cardiac risk factor is treated hyperlipidemia.  He is never had a heart attack or stroke.  He denies shortness of breath.  He does have exercise-induced asthma.  He exercises on a Peloton, and walks without limitation.  He has had some recent chest pressure which she attributes to reflux and was seen in the ER on 03/27/2021 with a negative work-up.  The pain does not radiate.  Can last for hours at a time.  Is not brought on by any activity.   Current Meds  Medication Sig  . albuterol (VENTOLIN HFA) 108 (90 Base) MCG/ACT inhaler INHALE 2 PUFFS INTO THE LUNGS EVERY 4 (FOUR) HOURS AS NEEDED FOR WHEEZING OR SHORTNESS OF BREATH.  [name] Kitchen atorvastatin (LIPITOR) 20 MG tablet Daily  . loratadine (CLARITIN) 10 MG tablet Take 1 tablet (10 mg total) by mouth daily.  . Multiple Vitamin (MULTIVITAMIN) tablet Take 1 tablet by mouth daily. Reported on 02/14/2016  . pantoprazole (PROTONIX) 40 MG tablet Take 1 tablet (40 mg total) by mouth daily.     No Known Allergies  Social History   Socioeconomic History  . Marital status: Married    Spouse name: Not on file  . Number of children: Not on file  . Years of education: Not on file  . Highest education level: Not on file  Occupational History  . Not on file  Tobacco Use  . Smoking status: Never Smoker  . Smokeless tobacco: Never Used  Substance and Sexual Activity  . Alcohol use: Yes    Alcohol/week: 0.0 standard drinks    Comment: occ  . Drug use: No  . Sexual activity: Not on file  Other Topics Concern  . Not on file  Social History Narrative  . Not on  file   Social Determinants of Health   Financial Resource Strain: Not on file  Food Insecurity: Not on file  Transportation Needs: Not on file  Physical Activity: Not on file  Stress: Not on file  Social Connections: Not on file  Intimate Partner Violence: Not on file     Review of Systems: General: negative for chills, fever, night sweats or weight changes.  Cardiovascular: negative for chest pain, dyspnea on exertion, edema, orthopnea, palpitations, paroxysmal nocturnal dyspnea or shortness of breath Dermatological: negative for rash Respiratory: negative for cough or wheezing Urologic: negative for hematuria Abdominal: negative for nausea, vomiting, diarrhea, bright red blood per rectum, melena, or hematemesis Neurologic: negative for visual changes, syncope, or dizziness All other systems reviewed and are otherwise negative except as noted above.    Blood pressure 136/78, pulse 96, height 5\' 6"  (1.676 m), weight 246 lb (111.6 kg), SpO2 97 %.  General appearance: alert and no distress Neck: no adenopathy, no carotid bruit, no JVD, supple, symmetrical, trachea midline and thyroid not enlarged, symmetric, no tenderness/mass/nodules Lungs: clear to auscultation bilaterally Heart: regular rate and rhythm, S1, S2 normal, no murmur, click, rub or gallop Extremities: extremities normal, atraumatic, no cyanosis or edema Pulses: 2+ and symmetric Skin: Skin color, texture, turgor normal. No rashes or lesions Neurologic: Alert and  oriented X 3, normal strength and tone. Normal symmetric reflexes. Normal coordination and gait  EKG not performed today  ASSESSMENT AND PLAN:   HYPERLIPIDEMIA History of hyperlipidemia on statin therapy with lipid profile performed 11/07/2020 revealing total cholesterol 171, LDL 110 and HDL of 37.  Morbid obesity (HCC) BMI close to 40.  In the process of trying to lose weight.  Atypical chest pain Recent ER eval on 03/27/2021 for atypical chest pain.   The pain is a pressure pain, left precordial without radiation lasting for hours at a time, its not made worse by any activity.  He has minimal cardiac risk factors.  I am going to get a routine GXT and a coronary calcium score to further evaluate.      Brent Gess MD FACP,FACC,FAHA, [facility] 04/06/2021 10:12 AM

## 2021-04-06 NOTE — Assessment & Plan Note (Signed)
Recent ER eval on 03/27/2021 for atypical chest pain.  The pain is a pressure pain, left precordial without radiation lasting for hours at a time, its not made worse by any activity.  He has minimal cardiac risk factors.  I am going to get a routine GXT and a coronary calcium score to further evaluate.

## 2021-04-20 ENCOUNTER — Other Ambulatory Visit: Payer: Self-pay

## 2021-07-10 ENCOUNTER — Telehealth (INDEPENDENT_AMBULATORY_CARE_PROVIDER_SITE_OTHER): Payer: 59 | Admitting: Family Medicine

## 2021-07-10 ENCOUNTER — Encounter: Payer: Self-pay | Admitting: Family Medicine

## 2021-07-10 DIAGNOSIS — R6882 Decreased libido: Secondary | ICD-10-CM | POA: Diagnosis not present

## 2021-07-10 NOTE — Progress Notes (Signed)
Subjective:    Patient ID: Brent Watson, male    DOB: February 07, 1976, 45 y.o.   MRN: [id number]  HPI Here to discuss a loss of libido. Over the last year he has noticed his overall libido decreasing. His interest in sex is lower, and sometimes this causes some erection problems. At other times it is less noticeable. He is usually more aroused in the mornings than in the evenings. He had recent lab work which ruled out anemia, diabetes, and other issues.  Virtual Visit via Video Note  I connected with the patient on 07/10/21 at  4:00 PM EDT by a video enabled telemedicine application and verified that I am speaking with the correct person using two identifiers.  Location patient: home Location provider:work or home office Persons participating in the virtual visit: patient, provider  I discussed the limitations of evaluation and management by telemedicine and the availability of in person appointments. The patient expressed understanding and agreed to proceed.   HPI:    ROS: See pertinent positives and negatives per HPI.  Past Medical History:  Diagnosis Date   Allergy    GERD (gastroesophageal reflux disease)    Hyperlipidemia     Past Surgical History:  Procedure Laterality Date   bilateral  inguinal hernia repair     HYDROCELE EXCISION Left 06/10/2015   Procedure: LEFT HYDROCELECTOMY ADULT;  Surgeon: Su Grand, MD;  Location: [facility];  Service: Urology;  Laterality: Left;   VASECTOMY Bilateral 06/10/2015   Procedure: BILATERAL VASECTOMY;  Surgeon: Su Grand, MD;  Location[facility];  Service: Urology;  Laterality: Bilateral;    Family History  Problem Relation Age of Onset   Sleep apnea Brother    Diabetes Other    Hyperlipidemia Other    Hypertension Other      Current Outpatient Medications:    albuterol (VENTOLIN HFA) 108 (90 Base) MCG/ACT inhaler, INHALE 2 PUFFS INTO THE LUNGS EVERY 4 (FOUR) HOURS AS NEEDED FOR WHEEZING OR  SHORTNESS OF BREATH., Disp: 18 each, Rfl: 0   atorvastatin (LIPITOR) 20 MG tablet, Daily, Disp: 90 tablet, Rfl: 3   loratadine (CLARITIN) 10 MG tablet, Take 1 tablet (10 mg total) by mouth daily., Disp: 30 tablet, Rfl: 11   Multiple Vitamin (MULTIVITAMIN) tablet, Take 1 tablet by mouth daily. Reported on 02/14/2016, Disp: , Rfl:    pantoprazole (PROTONIX) 40 MG tablet, Take 1 tablet (40 mg total) by mouth daily., Disp: 90 tablet, Rfl: 3  EXAM:  VITALS per patient if applicable:  GENERAL: alert, oriented, appears well and in no acute distress  HEENT: atraumatic, conjunttiva clear, no obvious abnormalities on inspection of external nose and ears  NECK: normal movements of the head and neck  LUNGS: on inspection no signs of respiratory distress, breathing rate appears normal, no obvious gross SOB, gasping or wheezing  CV: no obvious cyanosis  MS: moves all visible extremities without noticeable abnormality  PSYCH/NEURO: pleasant and cooperative, no obvious depression or anxiety, speech and thought processing grossly intact  ASSESSMENT AND PLAN: Low libido. We will check a thyroid panel and a testosterone level in the near future.  Gershon Crane, MD  Discussed the following assessment and plan:  Diminished libido - Plan: Testosterone, T3, free, T4, free, TSH     I discussed the assessment and treatment plan with the patient. The patient was provided an opportunity to ask questions and all were answered. The patient agreed with the plan and demonstrated an understanding of  the instructions.   The patient was advised to call back or seek an in-person evaluation if the symptoms worsen or if the condition fails to improve as anticipated.     Review of Systems     Objective:   Physical Exam        Assessment & Plan:

## 2021-07-11 ENCOUNTER — Other Ambulatory Visit (INDEPENDENT_AMBULATORY_CARE_PROVIDER_SITE_OTHER): Payer: 59

## 2021-07-11 ENCOUNTER — Other Ambulatory Visit: Payer: Self-pay

## 2021-07-11 DIAGNOSIS — R6882 Decreased libido: Secondary | ICD-10-CM | POA: Diagnosis not present

## 2021-07-11 LAB — TSH: TSH: 1.58 u[IU]/mL (ref 0.35–5.50)

## 2021-07-11 LAB — T3, FREE: T3, Free: 3.2 pg/mL (ref 2.3–4.2)

## 2021-07-11 LAB — TESTOSTERONE: Testosterone: 226.51 ng/dL — ABNORMAL LOW (ref 300.00–890.00)

## 2021-07-11 LAB — T4, FREE: Free T4: 0.82 ng/dL (ref 0.60–1.60)

## 2021-07-12 ENCOUNTER — Encounter: Payer: Self-pay | Admitting: Family Medicine

## 2021-07-13 MED ORDER — "LUER LOCK SAFETY SYRINGES 22G X 1-1/2"" 3 ML MISC": 1.0000 "application " | 0 refills | Status: DC

## 2021-07-13 MED ORDER — TESTOSTERONE CYPIONATE 200 MG/ML IM SOLN: 200.0000 mg | INTRAMUSCULAR | 1 refills | Status: DC

## 2021-07-13 NOTE — Telephone Encounter (Signed)
We will use the injectable form. I will send this to his pharmacy. We will plan to repeat a testosterone blood level in 6 months

## 2021-07-19 ENCOUNTER — Telehealth: Payer: Self-pay | Admitting: Cardiovascular Disease

## 2021-07-19 NOTE — Telephone Encounter (Signed)
Mr. Brent Watson was schedule for GXT and Cardiac Score in June.   He cancel both test.   We have reach out to him several times.   Spoke with him today and he stated he didn't want to do the test and he is feeling ok.   Order will be cancel.  l

## 2021-09-01 ENCOUNTER — Other Ambulatory Visit: Payer: Self-pay

## 2021-09-01 ENCOUNTER — Encounter: Payer: Self-pay | Admitting: Family Medicine

## 2021-09-01 ENCOUNTER — Ambulatory Visit: Payer: 59 | Admitting: Family Medicine

## 2021-09-01 VITALS — BP 128/82 | HR 101 | Temp 98.6°F | Wt 246.0 lb

## 2021-09-01 DIAGNOSIS — J40 Bronchitis, not specified as acute or chronic: Secondary | ICD-10-CM | POA: Diagnosis not present

## 2021-09-01 MED ORDER — AZITHROMYCIN 250 MG PO TABS: ORAL_TABLET | ORAL | 0 refills | Status: DC

## 2021-09-01 NOTE — Progress Notes (Signed)
   Subjective:    Patient ID: Brent Watson, male    DOB: Jan 05, 1976, 45 y.o.   MRN: [id number]  HPI Here for 10 days of chest congestion and a dry cough. No SOB or fever. He has not had to use his inhaler as yet. He tested negative for the Covid-19 virus 6 days ago.    Review of Systems     Objective:   Physical Exam        Assessment & Plan:

## 2021-11-08 ENCOUNTER — Ambulatory Visit (INDEPENDENT_AMBULATORY_CARE_PROVIDER_SITE_OTHER): Payer: 59 | Admitting: Family Medicine

## 2021-11-08 ENCOUNTER — Encounter: Payer: Self-pay | Admitting: Family Medicine

## 2021-11-08 VITALS — BP 130/90 | HR 84 | Temp 97.8°F | Ht 66.0 in | Wt 247.0 lb

## 2021-11-08 DIAGNOSIS — R739 Hyperglycemia, unspecified: Secondary | ICD-10-CM

## 2021-11-08 DIAGNOSIS — Z0000 Encounter for general adult medical examination without abnormal findings: Secondary | ICD-10-CM

## 2021-11-08 DIAGNOSIS — Z23 Encounter for immunization: Secondary | ICD-10-CM

## 2021-11-08 DIAGNOSIS — E291 Testicular hypofunction: Secondary | ICD-10-CM

## 2021-11-08 LAB — HEPATIC FUNCTION PANEL
ALT: 38 U/L (ref 0–53)
AST: 28 U/L (ref 0–37)
Albumin: 4.6 g/dL (ref 3.5–5.2)
Alkaline Phosphatase: 40 U/L (ref 39–117)
Bilirubin, Direct: 0.2 mg/dL (ref 0.0–0.3)
Total Bilirubin: 0.9 mg/dL (ref 0.2–1.2)
Total Protein: 7.4 g/dL (ref 6.0–8.3)

## 2021-11-08 LAB — CBC WITH DIFFERENTIAL/PLATELET
Basophils Absolute: 0 10*3/uL (ref 0.0–0.1)
Basophils Relative: 0.5 % (ref 0.0–3.0)
Eosinophils Absolute: 0.2 10*3/uL (ref 0.0–0.7)
Eosinophils Relative: 2.9 % (ref 0.0–5.0)
HCT: 48.2 % (ref 39.0–52.0)
Hemoglobin: 15.5 g/dL (ref 13.0–17.0)
Lymphocytes Relative: 22 % (ref 12.0–46.0)
Lymphs Abs: 1.4 10*3/uL (ref 0.7–4.0)
MCHC: 32.2 g/dL (ref 30.0–36.0)
MCV: 86.9 fl (ref 78.0–100.0)
Monocytes Absolute: 0.5 10*3/uL (ref 0.1–1.0)
Monocytes Relative: 7.5 % (ref 3.0–12.0)
Neutro Abs: 4.4 10*3/uL (ref 1.4–7.7)
Neutrophils Relative %: 67.1 % (ref 43.0–77.0)
Platelets: 257 10*3/uL (ref 150.0–400.0)
RBC: 5.55 Mil/uL (ref 4.22–5.81)
RDW: 14.6 % (ref 11.5–15.5)
WBC: 6.6 10*3/uL (ref 4.0–10.5)

## 2021-11-08 LAB — BASIC METABOLIC PANEL
BUN: 13 mg/dL (ref 6–23)
CO2: 30 mEq/L (ref 19–32)
Calcium: 9.9 mg/dL (ref 8.4–10.5)
Chloride: 102 mEq/L (ref 96–112)
Creatinine, Ser: 1.39 mg/dL (ref 0.40–1.50)
GFR: 62.27 mL/min (ref >60.00)
Glucose, Bld: 98 mg/dL (ref 70–99)
Potassium: 4.3 mEq/L (ref 3.5–5.1)
Sodium: 139 mEq/L (ref 135–145)

## 2021-11-08 LAB — LIPID PANEL
Cholesterol: 177 mg/dL (ref 0–200)
HDL: 36.5 mg/dL — ABNORMAL LOW (ref >39.00)
LDL Cholesterol: 123 mg/dL — ABNORMAL HIGH (ref 0–99)
NonHDL: 140.09
Total CHOL/HDL Ratio: 5
Triglycerides: 84 mg/dL (ref 0.0–149.0)
VLDL: 16.8 mg/dL (ref 0.0–40.0)

## 2021-11-08 LAB — HEMOGLOBIN A1C: Hgb A1c MFr Bld: 6.6 % — ABNORMAL HIGH (ref 4.6–6.5)

## 2021-11-08 LAB — TESTOSTERONE: Testosterone: 218.61 ng/dL — ABNORMAL LOW (ref 300.00–890.00)

## 2021-11-08 LAB — PSA: PSA: 0.43 ng/mL (ref 0.10–4.00)

## 2021-11-08 MED ORDER — ATORVASTATIN CALCIUM 20 MG PO TABS: ORAL_TABLET | ORAL | 3 refills | Status: DC

## 2021-11-08 MED ORDER — PANTOPRAZOLE SODIUM 40 MG PO TBEC: 40.0000 mg | DELAYED_RELEASE_TABLET | Freq: Every day | ORAL | 3 refills | Status: DC

## 2021-11-08 MED ORDER — ALBUTEROL SULFATE HFA 108 (90 BASE) MCG/ACT IN AERS: 2.0000 | INHALATION_SPRAY | RESPIRATORY_TRACT | 5 refills | Status: DC | PRN

## 2021-11-08 NOTE — Addendum Note (Signed)
Addended by: Carola Rhine on: 11/08/2021 09:48 AM   Modules accepted: Orders

## 2021-11-08 NOTE — Progress Notes (Signed)
° °  Subjective:    Patient ID: Brent Watson, male    DOB: 1976-02-09, 45 y.o.   MRN: [id number]  HPI Here for a well exam. He feels well in general. He has been taking testosterone shots, and these have been very helpful to him. His libido has improved as well as his erections.    Review of Systems  Constitutional: Negative.   HENT: Negative.    Eyes: Negative.   Respiratory: Negative.    Cardiovascular: Negative.   Gastrointestinal: Negative.   Genitourinary: Negative.   Musculoskeletal: Negative.   Skin: Negative.   Neurological: Negative.   Psychiatric/Behavioral: Negative.        Objective:   Physical Exam Constitutional:      General: He is not in acute distress.    Appearance: Normal appearance. He is well-developed. He is not diaphoretic.  HENT:     Head: Normocephalic and atraumatic.     Right Ear: External ear normal.     Left Ear: External ear normal.     Nose: Nose normal.     Mouth/Throat:     Pharynx: No oropharyngeal exudate.  Eyes:     General: No scleral icterus.       Right eye: No discharge.        Left eye: No discharge.     Conjunctiva/sclera: Conjunctivae normal.     Pupils: Pupils are equal, round, and reactive to light.  Neck:     Thyroid: No thyromegaly.     Vascular: No JVD.     Trachea: No tracheal deviation.  Cardiovascular:     Rate and Rhythm: Normal rate and regular rhythm.     Heart sounds: Normal heart sounds. No murmur heard.   No friction rub. No gallop.  Pulmonary:     Effort: Pulmonary effort is normal. No respiratory distress.     Breath sounds: Normal breath sounds. No wheezing or rales.  Chest:     Chest wall: No tenderness.  Abdominal:     General: Bowel sounds are normal. There is no distension.     Palpations: Abdomen is soft. There is no mass.     Tenderness: There is no abdominal tenderness. There is no guarding or rebound.  Genitourinary:    Penis: Normal. No tenderness.      Testes: Normal.     Prostate:  Normal.     Rectum: Normal. Guaiac result negative.  Musculoskeletal:        General: No tenderness. Normal range of motion.     Cervical back: Neck supple.  Lymphadenopathy:     Cervical: No cervical adenopathy.  Skin:    General: Skin is warm and dry.     Coloration: Skin is not pale.     Findings: No erythema or rash.  Neurological:     Mental Status: He is alert and oriented to person, place, and time.     Cranial Nerves: No cranial nerve deficit.     Motor: No abnormal muscle tone.     Coordination: Coordination normal.     Deep Tendon Reflexes: Reflexes are normal and symmetric. Reflexes normal.  Psychiatric:        Behavior: Behavior normal.        Thought Content: Thought content normal.        Judgment: Judgment normal.          Assessment & Plan:  Well exam. We discussed diet and exercise. Get fasting labs.  Gershon Crane, MD

## 2021-11-09 MED ORDER — TESTOSTERONE CYPIONATE 200 MG/ML IM SOLN: 200.0000 mg | INTRAMUSCULAR | 5 refills | Status: DC

## 2021-11-09 NOTE — Addendum Note (Signed)
Addended by: Gershon Crane A on: 11/09/2021 12:30 PM   Modules accepted: Orders

## 2021-11-09 NOTE — Addendum Note (Signed)
Addended by: Johnella Moloney on: 11/09/2021 09:29 AM   Modules accepted: Orders

## 2022-01-03 ENCOUNTER — Telehealth: Payer: Self-pay | Admitting: Family Medicine

## 2022-01-03 NOTE — Telephone Encounter (Signed)
Set him up for an OV

## 2022-01-03 NOTE — Telephone Encounter (Signed)
Spoke with patient.   Over the last several days he has had problems with hemorrhoids.  On Saturday and Sunday blood was seen after having a bowel movements.  Patient started using witch hazel wipes  and Sitzs bath, which brought him some relief from having so much blood.    Over the past few days, when having a bowel movement, patient was still seeing blood, not as much as before also not having any pain   Patient is very concerned about this.   Please advise

## 2022-01-03 NOTE — Telephone Encounter (Signed)
Question has a question about a Hemorroid and is asking for a call back.

## 2022-01-04 NOTE — Telephone Encounter (Signed)
Spoke with patient.   Appointment scheduled for 01/05/22 at 9:45am.

## 2022-01-05 ENCOUNTER — Ambulatory Visit: Payer: 59 | Admitting: Family Medicine

## 2022-01-05 ENCOUNTER — Encounter: Payer: Self-pay | Admitting: Family Medicine

## 2022-01-05 VITALS — BP 130/70 | HR 100 | Temp 98.3°F | Ht 66.0 in | Wt 245.6 lb

## 2022-01-05 DIAGNOSIS — E291 Testicular hypofunction: Secondary | ICD-10-CM | POA: Diagnosis not present

## 2022-01-05 DIAGNOSIS — K649 Unspecified hemorrhoids: Secondary | ICD-10-CM | POA: Diagnosis not present

## 2022-01-05 MED ORDER — TESTOSTERONE CYPIONATE 200 MG/ML IM SOLN: 200.0000 mg | INTRAMUSCULAR | 5 refills | Status: DC

## 2022-01-05 NOTE — Progress Notes (Signed)
° °  Subjective:    Patient ID: Brent Watson, male    DOB: Nov 14, 1975, 46 y.o.   MRN: [id number]  HPI Here for 2 issues. First he was diagnosed with external hemorrhoids several years ago, and he quite often sees a tiny bit of bright red blood on the tissue when he wipes. These are not painful. He passes the stools easily without straining, but he says that typically have a large diameter. He has tried external agents like witch hazel and Preparation H with mixed results Second he asks about his testosterone RX. We saw him on 11-08-21 and his level that day was 218. At that time we intended to increase his dose from 200 mg every 14 days to taking it every 7 days. However when he picked up the RX it said to use it every 14 days as before, and that is what he has been doing. He recently realized what was going on and he asks about this.    Review of Systems  Constitutional: Negative.   Respiratory: Negative.    Cardiovascular: Negative.   Gastrointestinal:  Positive for anal bleeding. Negative for abdominal distention, abdominal pain, blood in stool, constipation, diarrhea and rectal pain.  Neurological: Negative.       Objective:   Physical Exam Constitutional:      Appearance: Normal appearance.  Cardiovascular:     Rate and Rhythm: Normal rate and regular rhythm.     Pulses: Normal pulses.     Heart sounds: Normal heart sounds.  Pulmonary:     Effort: Pulmonary effort is normal.     Breath sounds: Normal breath sounds.  Genitourinary:    Comments: There is a single mildly enlarged hemorrhoid at the anal verge with a small clot of blood on it  Neurological:     Mental Status: He is alert.          Assessment & Plan:  As for the hemorrhoid, I suggested he try to make the stools as soft and as easy pass as possible. I advised him to use Metamucil every day to give this hemorrhoid a chance to heal. As for the testosterone, I wrote a new RX and printed it out to show he used it  every 7 days. He will show this to the pharmacist and make sure this is filled correctly.  Gershon Crane, MD

## 2022-02-09 ENCOUNTER — Other Ambulatory Visit (INDEPENDENT_AMBULATORY_CARE_PROVIDER_SITE_OTHER): Payer: 59

## 2022-02-09 DIAGNOSIS — R739 Hyperglycemia, unspecified: Secondary | ICD-10-CM

## 2022-02-09 LAB — HEMOGLOBIN A1C: Hgb A1c MFr Bld: 6.5 % (ref 4.6–6.5)

## 2022-04-16 IMAGING — CR DG CHEST 2V
2 series · 2 of 2 positions shown · non-contrast
Comparison: Two-view chest radiograph 01/07/2005

CLINICAL DATA: Chest pain and shortness of breath

EXAM:
CHEST - 2 VIEW

[chest pa]
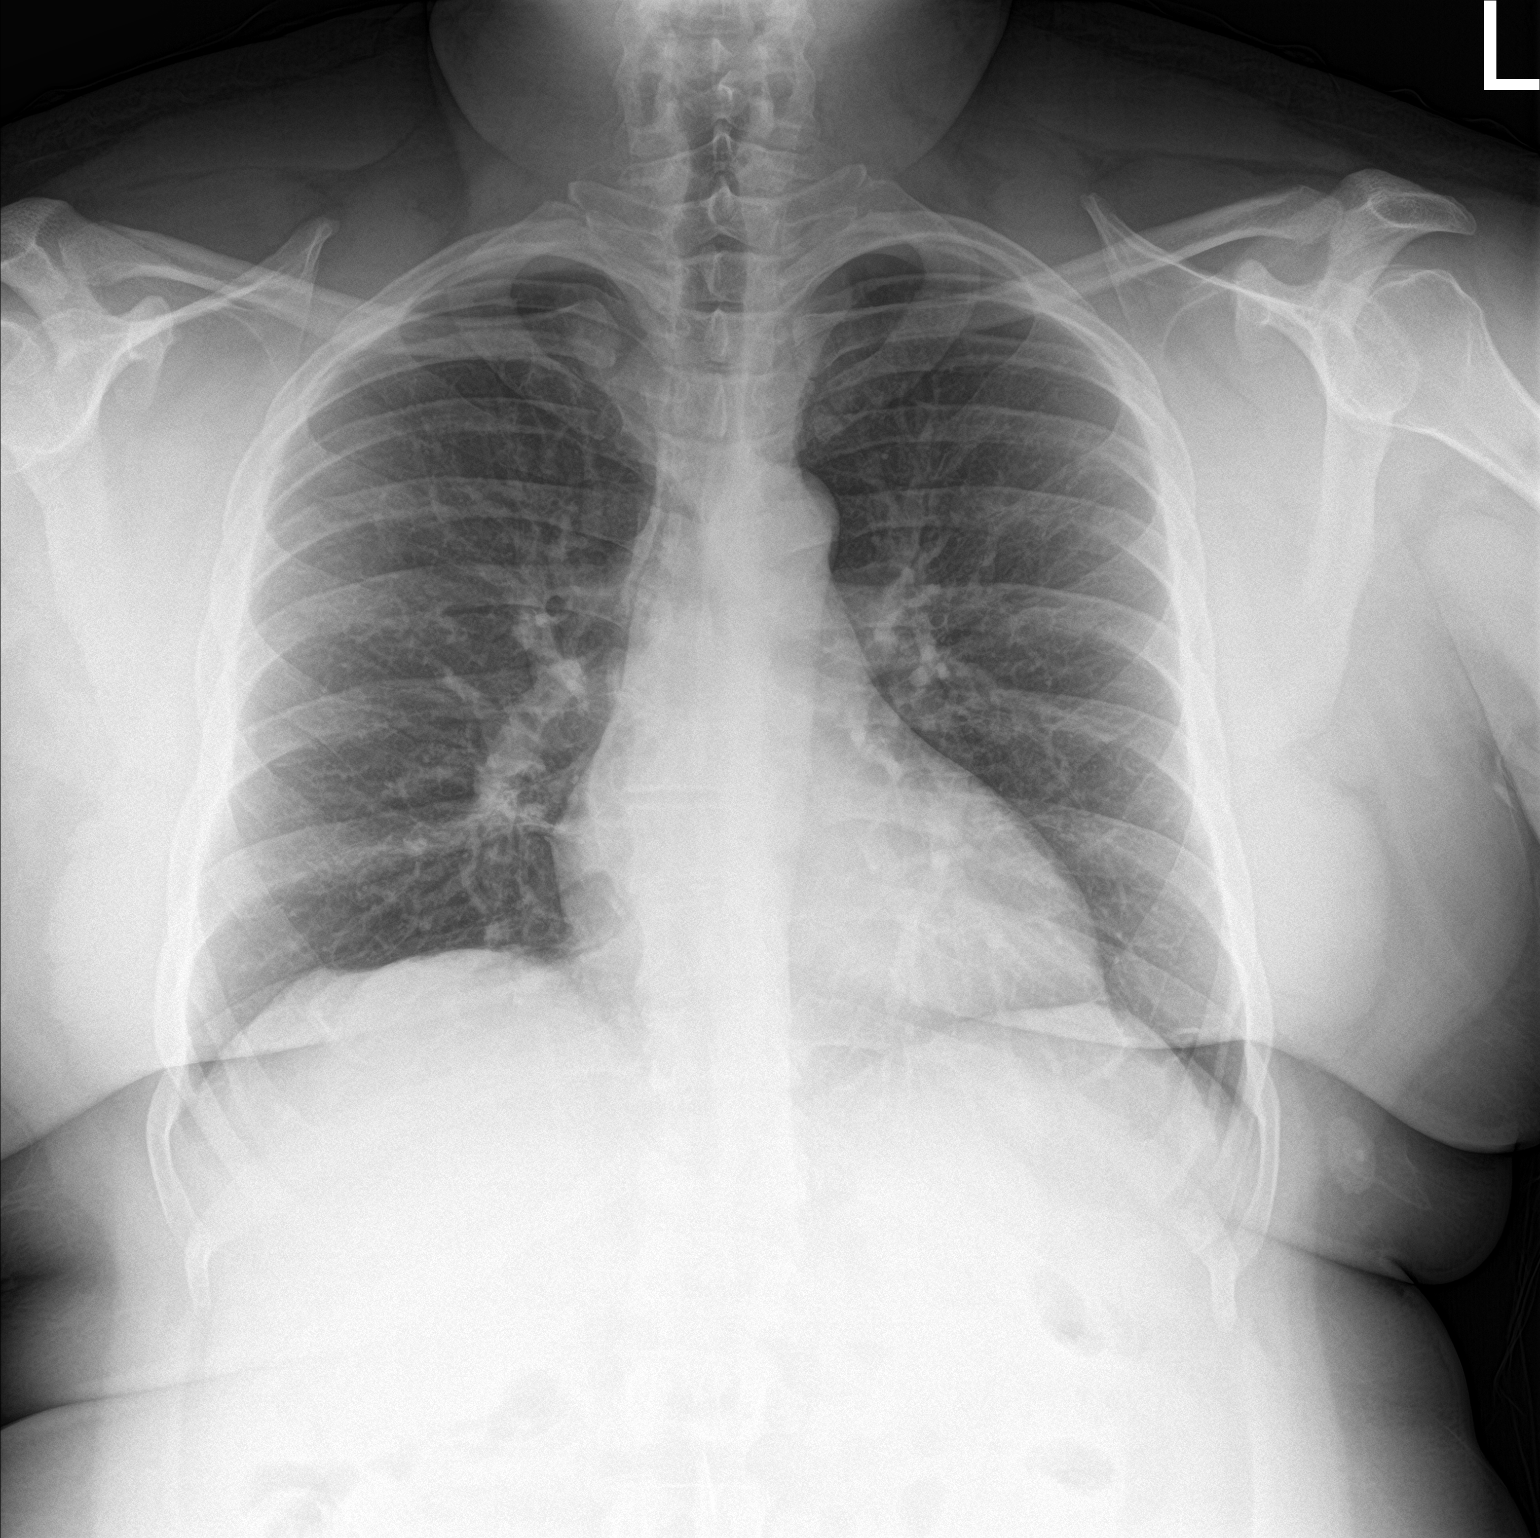

[chest lat]
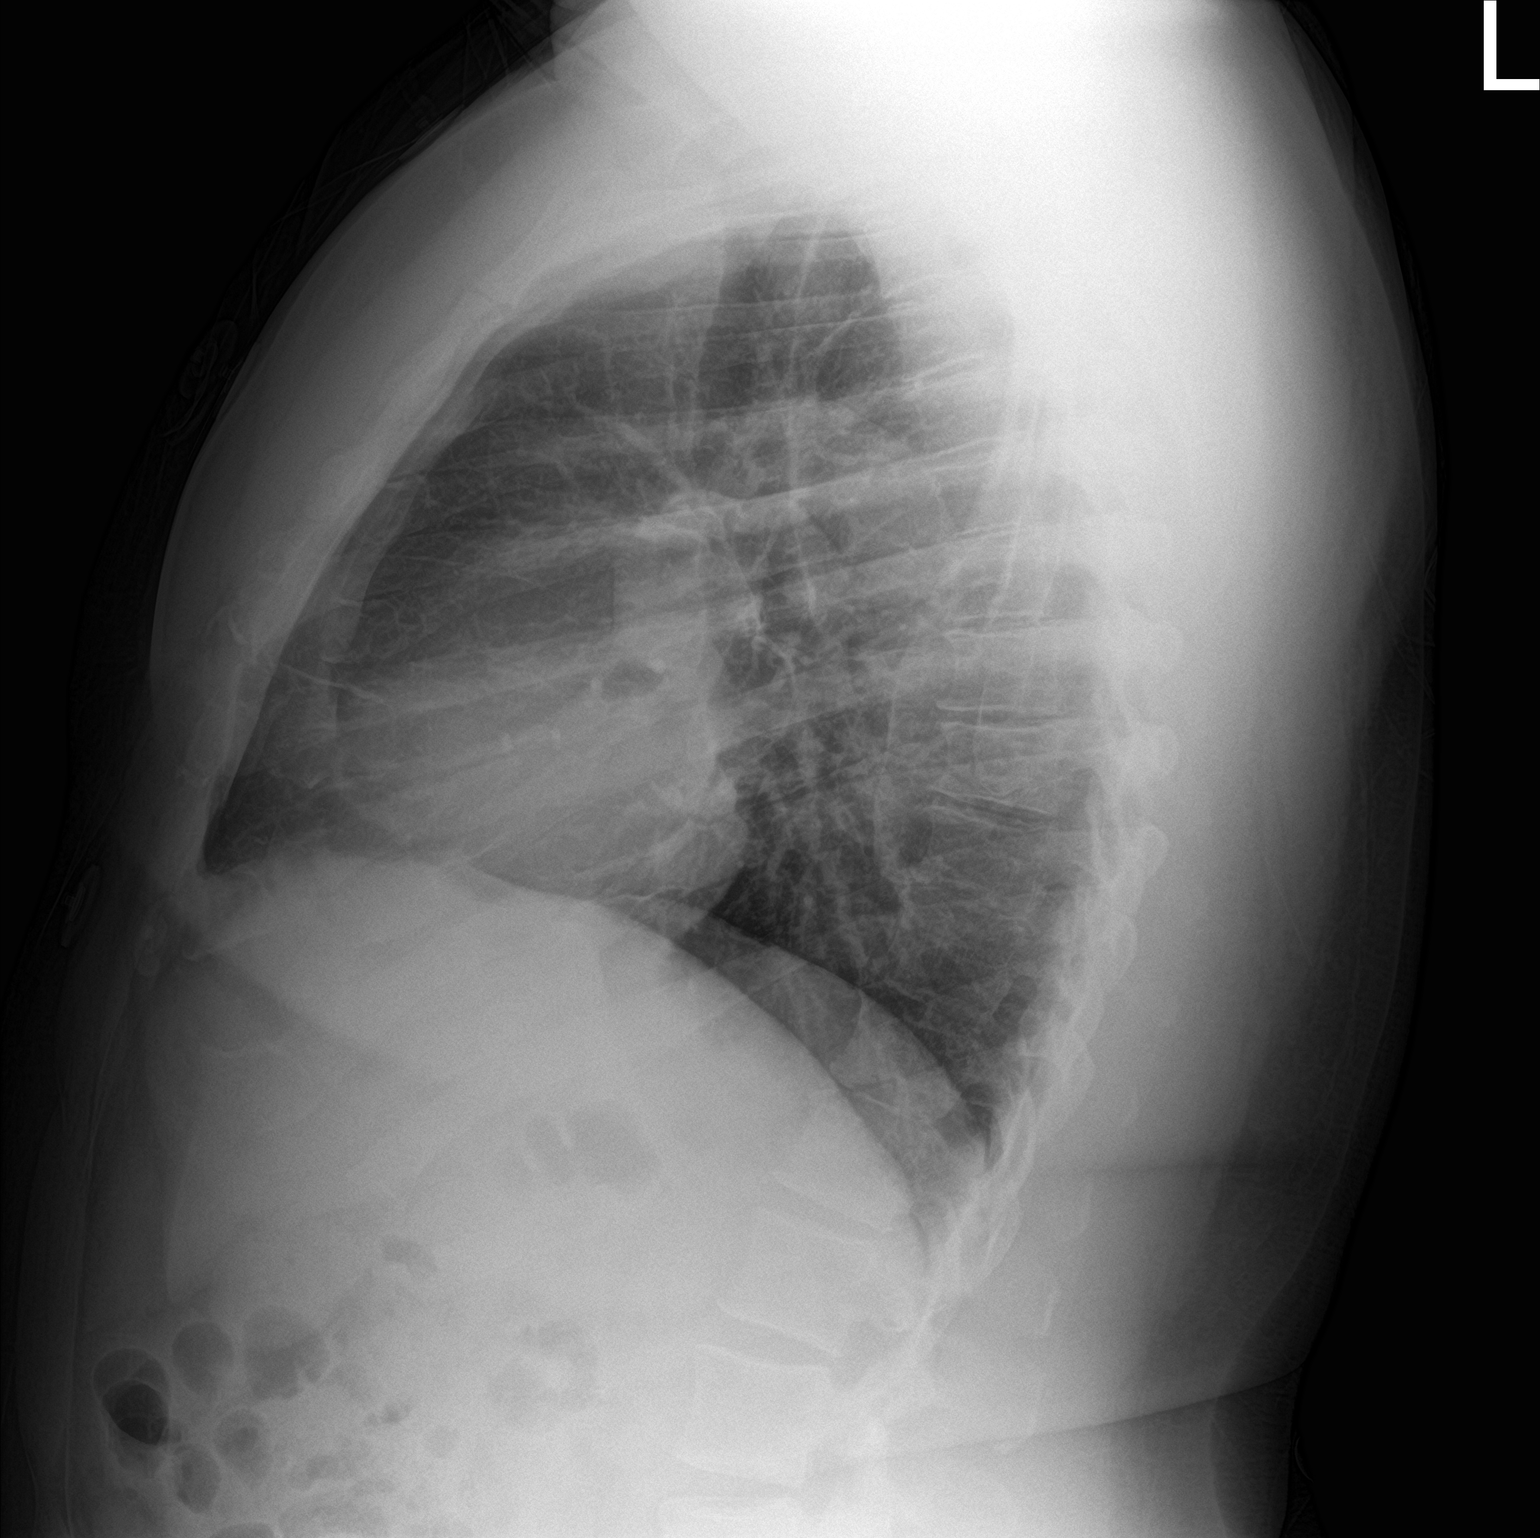

[2 of 2 positions shown; findings below may reference images not displayed]

FINDINGS: The heart size and mediastinal contours are within normal limits. No
focal airspace disease. No pleural effusion or pneumothorax. The
visualized skeletal structures are unremarkable.
IMPRESSION: No evidence of acute cardiopulmonary disease.

## 2022-05-22 ENCOUNTER — Ambulatory Visit: Payer: 59 | Admitting: Family Medicine

## 2022-05-23 ENCOUNTER — Ambulatory Visit: Payer: 59 | Admitting: Family Medicine

## 2022-07-12 ENCOUNTER — Other Ambulatory Visit: Payer: Self-pay | Admitting: Family Medicine

## 2022-07-12 NOTE — Telephone Encounter (Signed)
Last Rx given on 2/24 for 58ml with 5 ref

## 2022-08-15 ENCOUNTER — Ambulatory Visit: Payer: 59 | Admitting: Family Medicine

## 2022-08-15 ENCOUNTER — Encounter: Payer: Self-pay | Admitting: Family Medicine

## 2022-08-15 VITALS — BP 128/78 | HR 113 | Temp 98.0°F | Wt 243.0 lb

## 2022-08-15 DIAGNOSIS — L03012 Cellulitis of left finger: Secondary | ICD-10-CM

## 2022-08-15 MED ORDER — CEPHALEXIN 500 MG PO CAPS: 500.0000 mg | ORAL_CAPSULE | Freq: Three times a day (TID) | ORAL | 0 refills | Status: AC

## 2022-08-15 NOTE — Progress Notes (Signed)
   Subjective:    Patient ID: Brent Watson, male    DOB: 1976/07/18, 46 y.o.   MRN: [id number]  HPI Here for 4 days of pain and swelling at the tip of his left middle finger. No fever. He has been squeezing fluid from it.    Review of Systems  Constitutional: Negative.   Respiratory: Negative.    Cardiovascular: Negative.        Objective:   Physical Exam Constitutional:      General: He is not in acute distress.    Appearance: Normal appearance.  Cardiovascular:     Rate and Rhythm: Normal rate and regular rhythm.     Pulses: Normal pulses.     Heart sounds: Normal heart sounds.  Pulmonary:     Effort: Pulmonary effort is normal.     Breath sounds: Normal breath sounds.  Skin:    Comments: The skin all the way around the left 3rd finger tip is red, swollen, and tender. Purulent material is expressed from it   Neurological:     Mental Status: He is alert.           Assessment & Plan:  Paronychia, treat with 10 days of Keflex.  Alysia Penna, MD

## 2022-08-23 ENCOUNTER — Other Ambulatory Visit: Payer: Self-pay | Admitting: Adult Health

## 2022-08-23 NOTE — Telephone Encounter (Signed)
Okay for refill?  

## 2022-10-17 ENCOUNTER — Ambulatory Visit: Payer: 59 | Admitting: Family Medicine

## 2022-10-22 ENCOUNTER — Telehealth: Payer: Self-pay | Admitting: Family Medicine

## 2022-10-22 NOTE — Telephone Encounter (Signed)
Pt form was received on 10/22/22 placed on Dr Clent Ridges red folder

## 2022-10-22 NOTE — Telephone Encounter (Signed)
Pt dropped off forms to be signed by Dr. Gerlene Fee in folder. Pt is aware of the 5-7 business day turn around.   Please advise.

## 2022-10-26 NOTE — Telephone Encounter (Signed)
Spoke with pt advised per Dr Clent Ridges to schedule a nurse visit for TB skin test, pt is scheduled for 10/30/22. Form is on Desiray Orchard's desk

## 2022-10-30 ENCOUNTER — Ambulatory Visit (INDEPENDENT_AMBULATORY_CARE_PROVIDER_SITE_OTHER): Payer: 59

## 2022-10-30 DIAGNOSIS — Z111 Encounter for screening for respiratory tuberculosis: Secondary | ICD-10-CM | POA: Diagnosis not present

## 2022-10-30 NOTE — Progress Notes (Signed)
PPD Placement note Brent Watson, 46 y.o. male is here today for placement of PPD test Reason for PPD test: [facility] job Pt taken PPD test before: yes Verified in allergy area and with patient that they are not allergic to the products PPD is made of (Phenol or Tween). No:  Is patient taking any oral or IV steroid medication now or have they taken it in the last month? no Has the patient ever received the BCG vaccine?: no Has the patient been in recent contact with anyone known or suspected of having active TB disease?: no Patient's Country of origin?: [place] O: Alert and oriented in NAD. P:  PPD placed on 10/30/2022.  Patient advised to return for reading within 48-72 hours.

## 2022-11-01 ENCOUNTER — Ambulatory Visit (INDEPENDENT_AMBULATORY_CARE_PROVIDER_SITE_OTHER): Payer: 59

## 2022-11-01 DIAGNOSIS — Z111 Encounter for screening for respiratory tuberculosis: Secondary | ICD-10-CM

## 2022-11-01 LAB — TB SKIN TEST: TB Skin Test: POSITIVE

## 2022-11-01 NOTE — Progress Notes (Signed)
PPD Reading Note PPD read and results entered in Epic[employer]. Result: 10 mm induration. Interpretation: positive If test not read within 48-72 hours of initial placement, patient advised to repeat in other arm 1-3 weeks after this test. Allergic reaction: no   Padonda: can you cosign since PCP is out of office?

## 2022-11-02 ENCOUNTER — Telehealth: Payer: Self-pay | Admitting: Family Medicine

## 2022-11-02 NOTE — Telephone Encounter (Signed)
Spoke with pt advised of the chest x-ray results verbalized understanding. Pt is aware that his TB Screening form is ready for pick up at the office, pt will pick form on Tuesday due to the office closing for Christmas Holiday

## 2022-11-02 NOTE — Telephone Encounter (Signed)
Pt called, asking for a call back to discuss his TB shot and a bump on his arm. Please return his call.

## 2022-11-06 NOTE — Telephone Encounter (Signed)
Patient stopped by and picked up paperwork.       FYI

## 2022-11-07 NOTE — Telephone Encounter (Signed)
Noted  

## 2022-11-09 ENCOUNTER — Encounter: Payer: Self-pay | Admitting: Family Medicine

## 2022-11-09 ENCOUNTER — Ambulatory Visit (INDEPENDENT_AMBULATORY_CARE_PROVIDER_SITE_OTHER): Payer: 59 | Admitting: Family Medicine

## 2022-11-09 VITALS — BP 130/90 | HR 93 | Temp 97.7°F | Ht 66.0 in | Wt 244.0 lb

## 2022-11-09 DIAGNOSIS — E291 Testicular hypofunction: Secondary | ICD-10-CM | POA: Diagnosis not present

## 2022-11-09 DIAGNOSIS — G4730 Sleep apnea, unspecified: Secondary | ICD-10-CM

## 2022-11-09 DIAGNOSIS — Z0000 Encounter for general adult medical examination without abnormal findings: Secondary | ICD-10-CM

## 2022-11-09 MED ORDER — PANTOPRAZOLE SODIUM 40 MG PO TBEC
40.0000 mg | DELAYED_RELEASE_TABLET | Freq: Every day | ORAL | 3 refills | Status: DC
Start: 2022-11-09 — End: 2023-05-22

## 2022-11-09 MED ORDER — ALBUTEROL SULFATE HFA 108 (90 BASE) MCG/ACT IN AERS: 2.0000 | INHALATION_SPRAY | RESPIRATORY_TRACT | 5 refills | Status: DC | PRN

## 2022-11-09 MED ORDER — ATORVASTATIN CALCIUM 20 MG PO TABS: ORAL_TABLET | ORAL | 3 refills | Status: DC

## 2022-11-09 NOTE — Progress Notes (Signed)
Subjective:    Patient ID: Brent Watson, male    DOB: [birth year], 46 y.o.   MRN: [id number]  HPI Here for a well exam. He feels well but he wonders if he could have sleep apnea. He has snored for years, and his wife says this is getting worse. His brother uses a CPAP machine. He does not routinely check his BP.    Review of Systems  Constitutional: Negative.   HENT: Negative.    Eyes: Negative.   Respiratory: Negative.    Cardiovascular: Negative.   Gastrointestinal: Negative.   Genitourinary: Negative.   Musculoskeletal: Negative.   Skin: Negative.   Neurological: Negative.   Psychiatric/Behavioral: Negative.         Objective:   Physical Exam Constitutional:      General: He is not in acute distress.    Appearance: Normal appearance. He is well-developed. He is not diaphoretic.  HENT:     Head: Normocephalic and atraumatic.     Right Ear: External ear normal.     Left Ear: External ear normal.     Nose: Nose normal.     Mouth/Throat:     Pharynx: No oropharyngeal exudate.  Eyes:     General: No scleral icterus.       Right eye: No discharge.        Left eye: No discharge.     Conjunctiva/sclera: Conjunctivae normal.     Pupils: Pupils are equal, round, and reactive to light.  Neck:     Thyroid: No thyromegaly.     Vascular: No JVD.     Trachea: No tracheal deviation.  Cardiovascular:     Rate and Rhythm: Normal rate and regular rhythm.     Heart sounds: Normal heart sounds. No murmur heard.    No friction rub. No gallop.  Pulmonary:     Effort: Pulmonary effort is normal. No respiratory distress.     Breath sounds: Normal breath sounds. No wheezing or rales.  Chest:     Chest wall: No tenderness.  Abdominal:     General: Bowel sounds are normal. There is no distension.     Palpations: Abdomen is soft. There is no mass.     Tenderness: There is no abdominal tenderness. There is no guarding or rebound.  Genitourinary:    Penis: Normal. No tenderness.       Testes: Normal.     Prostate: Normal.     Rectum: Normal. Guaiac result negative.  Musculoskeletal:        General: No tenderness. Normal range of motion.     Cervical back: Neck supple.  Lymphadenopathy:     Cervical: No cervical adenopathy.  Skin:    General: Skin is warm and dry.     Coloration: Skin is not pale.     Findings: No erythema or rash.  Neurological:     Mental Status: He is alert and oriented to person, place, and time.     Cranial Nerves: No cranial nerve deficit.     Motor: No abnormal muscle tone.     Coordination: Coordination normal.     Deep Tendon Reflexes: Reflexes are normal and symmetric. Reflexes normal.  Psychiatric:        Behavior: Behavior normal.        Thought Content: Thought content normal.        Judgment: Judgment normal.           Assessment & Plan:  Well exam. We discussed diet  and exercise. Get fasting labs. His BP today is borderline high, so he will purchase a BP cuff to check this at home. He will cehck this twice a day for 2 weeks and then report back to [place]. We will refer him for a sleep study. Gershon Crane, MD

## 2022-11-13 ENCOUNTER — Other Ambulatory Visit (INDEPENDENT_AMBULATORY_CARE_PROVIDER_SITE_OTHER): Payer: 59

## 2022-11-13 DIAGNOSIS — Z0000 Encounter for general adult medical examination without abnormal findings: Secondary | ICD-10-CM | POA: Diagnosis not present

## 2022-11-13 DIAGNOSIS — E291 Testicular hypofunction: Secondary | ICD-10-CM

## 2022-11-13 LAB — LIPID PANEL
Cholesterol: 162 mg/dL (ref 0–200)
HDL: 35.3 mg/dL — ABNORMAL LOW (ref >39.00)
LDL Cholesterol: 102 mg/dL — ABNORMAL HIGH (ref 0–99)
NonHDL: 127.05
Total CHOL/HDL Ratio: 5
Triglycerides: 124 mg/dL (ref 0.0–149.0)
VLDL: 24.8 mg/dL (ref 0.0–40.0)

## 2022-11-13 LAB — PSA: PSA: 0.44 ng/mL (ref 0.10–4.00)

## 2022-11-13 LAB — CBC WITH DIFFERENTIAL/PLATELET
Basophils Absolute: 0 10*3/uL (ref 0.0–0.1)
Basophils Relative: 0.4 % (ref 0.0–3.0)
Eosinophils Absolute: 0.3 10*3/uL (ref 0.0–0.7)
Eosinophils Relative: 3.5 % (ref 0.0–5.0)
HCT: 49 % (ref 39.0–52.0)
Hemoglobin: 16.2 g/dL (ref 13.0–17.0)
Lymphocytes Relative: 20.4 % (ref 12.0–46.0)
Lymphs Abs: 1.8 10*3/uL (ref 0.7–4.0)
MCHC: 33 g/dL (ref 30.0–36.0)
MCV: 86.8 fl (ref 78.0–100.0)
Monocytes Absolute: 0.6 10*3/uL (ref 0.1–1.0)
Monocytes Relative: 7.2 % (ref 3.0–12.0)
Neutro Abs: 6.1 10*3/uL (ref 1.4–7.7)
Neutrophils Relative %: 68.5 % (ref 43.0–77.0)
Platelets: 227 10*3/uL (ref 150.0–400.0)
RBC: 5.64 Mil/uL (ref 4.22–5.81)
RDW: 15.2 % (ref 11.5–15.5)
WBC: 8.9 10*3/uL (ref 4.0–10.5)

## 2022-11-13 LAB — TESTOSTERONE: Testosterone: 1140.02 ng/dL — ABNORMAL HIGH (ref 300.00–890.00)

## 2022-11-13 LAB — BASIC METABOLIC PANEL
BUN: 11 mg/dL (ref 6–23)
CO2: 29 mEq/L (ref 19–32)
Calcium: 9.6 mg/dL (ref 8.4–10.5)
Chloride: 101 mEq/L (ref 96–112)
Creatinine, Ser: 1.37 mg/dL (ref 0.40–1.50)
GFR: 62.91 mL/min (ref >60.00)
Glucose, Bld: 97 mg/dL (ref 70–99)
Potassium: 4.1 mEq/L (ref 3.5–5.1)
Sodium: 138 mEq/L (ref 135–145)

## 2022-11-13 LAB — HEPATIC FUNCTION PANEL
ALT: 27 U/L (ref 0–53)
AST: 20 U/L (ref 0–37)
Albumin: 4.4 g/dL (ref 3.5–5.2)
Alkaline Phosphatase: 34 U/L — ABNORMAL LOW (ref 39–117)
Bilirubin, Direct: 0.1 mg/dL (ref 0.0–0.3)
Total Bilirubin: 0.6 mg/dL (ref 0.2–1.2)
Total Protein: 6.9 g/dL (ref 6.0–8.3)

## 2022-11-13 LAB — TSH: TSH: 2.64 u[IU]/mL (ref 0.35–5.50)

## 2022-11-13 LAB — HEMOGLOBIN A1C: Hgb A1c MFr Bld: 6.5 % (ref 4.6–6.5)

## 2022-11-16 ENCOUNTER — Encounter: Payer: Self-pay | Admitting: Family Medicine

## 2022-11-19 ENCOUNTER — Ambulatory Visit: Payer: 59 | Admitting: Family Medicine

## 2022-11-19 ENCOUNTER — Encounter: Payer: Self-pay | Admitting: Family Medicine

## 2022-11-19 VITALS — BP 140/110 | HR 99 | Temp 98.2°F | Ht 66.0 in | Wt 245.0 lb

## 2022-11-19 DIAGNOSIS — L02821 Furuncle of head [any part, except face]: Secondary | ICD-10-CM

## 2022-11-19 MED ORDER — DOXYCYCLINE HYCLATE 100 MG PO CAPS: 100.0000 mg | ORAL_CAPSULE | Freq: Two times a day (BID) | ORAL | 0 refills | Status: AC

## 2022-11-19 NOTE — Progress Notes (Signed)
   Subjective:    Patient ID: Brent Watson, male    DOB: 18-Dec-1975, 47 y.o.   MRN: [id number]  HPI Here to check a painful lump on his scalp that has bled slightly. This came up a week ago.    Review of Systems  Constitutional: Negative.   Respiratory: Negative.    Cardiovascular: Negative.   Skin:  Positive for wound.       Objective:   Physical Exam Constitutional:      Appearance: Normal appearance.  Cardiovascular:     Rate and Rhythm: Normal rate and regular rhythm.     Pulses: Normal pulses.     Heart sounds: Normal heart sounds.  Pulmonary:     Effort: Pulmonary effort is normal.     Breath sounds: Normal breath sounds.  Skin:    Comments: There is a 1 cm tender boil on the scalp vertex, lightly draining   Neurological:     Mental Status: He is alert.           Assessment & Plan:  Boil. We applied pressure to the site and were able to express a moderate amount of purulent material. He will clean this gently in the shower daily. Take 10 days of Doxycycline. Recheck as needed. Alysia Penna, MD

## 2022-11-20 ENCOUNTER — Ambulatory Visit: Payer: 59 | Admitting: Family Medicine

## 2022-11-23 ENCOUNTER — Encounter: Payer: Self-pay | Admitting: Family Medicine

## 2022-11-23 ENCOUNTER — Other Ambulatory Visit: Payer: Self-pay

## 2022-11-23 MED ORDER — LOSARTAN POTASSIUM 50 MG PO TABS: 50.0000 mg | ORAL_TABLET | Freq: Every day | ORAL | 2 refills | Status: DC

## 2022-11-23 NOTE — Telephone Encounter (Signed)
Call in Losartan 50 mg daily, #30 with 2 rf. See me OV in 30 days

## 2022-12-11 ENCOUNTER — Encounter: Payer: Self-pay | Admitting: Adult Health

## 2022-12-11 ENCOUNTER — Ambulatory Visit: Payer: 59 | Admitting: Adult Health

## 2022-12-11 ENCOUNTER — Telehealth: Payer: Self-pay | Admitting: Family Medicine

## 2022-12-11 VITALS — BP 118/78 | HR 88 | Temp 97.9°F | Ht 66.0 in | Wt 242.4 lb

## 2022-12-11 DIAGNOSIS — R0683 Snoring: Secondary | ICD-10-CM | POA: Diagnosis not present

## 2022-12-11 NOTE — Progress Notes (Signed)
I am doing like a lot of sleep today tongue asleep today spasm and or something I am we will get a coffee at lunch  @Patient  ID: Brent Watson, male    DOB: [birth year], 47 y.o.   MRN: [id number]  No chief complaint on file.   Referring provider: Laurey Morale, MD  HPI: 48 yo male seen for sleep consult 12/11/22 for snoring, restless sleep and daytime sleepiness  TEST/EVENTS :   12/11/2022 Sleep consult  Patient presents for sleep consult for snoring and daytime sleepiness. Kindly referred by Primary care provider, Dr. Sarajane Jews.  Patient complains of long history of snoring however over the last 2 years seems to be getting worse.  Has intermittent restless sleep, wakes up tired in the morning and has daytime sleepiness.  Symptoms are definitely worse if he drinks alcohol.  He occasionally drinks alcohol on the weekends socially.  Patient did get a over-the-counter mouthguard which has helped some with his snoring.  His snoring has been interfering with his spouses sleep.  Patient typically goes to bed about 10:30 PM.  Takes only a few minutes to go to sleep.  Is up a couple times at night.  Gets up about 530 to 6:30 AM.  Patient's weights been pretty stable.  Current weight is at 242 pounds with a BMI at 39.  Has minimal caffeine intake.  If he is able he can take a nap daily.  Usually about 20 minutes.  Has no history of congestive heart failure or stroke.  No removable dental work.  No symptoms suspicious for cataplexy or sleep paralysis.  Does not take any sleep aids.  Epworth score is 6 out of 24.  Typically gets sleepy if he sits down to watch TV, rest or in the afternoon hours.  Medical history significant for hypertension, hyperlipidemia, chronic allergies, exercise-induced asthma, GERD, low testosterone.  Surgical history none  Family history positive for sleep apnea in her brother, heart disease, leukemia in his father.  Social history patient is married.  Has 3 children.  Ages 58, 72 and  60.  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  Smokes about 2 cigars a month.  Drinks alcohol socially on the weekends.  And does not use any drugs.  No Known Allergies  Immunization History  Administered Date(s) Administered   Influenza,inj,Quad PF,6+ Mos 09/27/2015   PFIZER Comirnaty(Gray Top)Covid-19 Tri-Sucrose Vaccine 01/25/2020, 02/15/2020   PPD Test 10/30/2022   Tdap 07/19/2011, 11/08/2021    Past Medical History:  Diagnosis Date   Allergy    GERD (gastroesophageal reflux disease)    Hyperlipidemia    Hypogonadism in male     Tobacco History: Social History   Tobacco Use  Smoking Status Never  Smokeless Tobacco Never   Counseling given: Not Answered   Outpatient Medications Prior to Visit  Medication Sig Dispense Refill   albuterol (VENTOLIN HFA) 108 (90 Base) MCG/ACT inhaler Inhale 2 puffs into the lungs every 4 (four) hours as needed for wheezing or shortness of breath. 18 each 5   atorvastatin (LIPITOR) 20 MG tablet Daily 90 tablet 3   loratadine (CLARITIN) 10 MG tablet Take 1 tablet (10 mg total) by mouth daily. 30 tablet 11   losartan (COZAAR) 50 MG tablet Take 1 tablet (50 mg total) by mouth daily. 30 tablet 2   Multiple Vitamin (MULTIVITAMIN) tablet Take 1 tablet by mouth daily. Reported on 02/14/2016     pantoprazole (PROTONIX) 40 MG tablet Take 1 tablet (  40 mg total) by mouth daily. 90 tablet 3   SYRINGE-NEEDLE, DISP, 3 ML (LUER LOCK SAFETY SYRINGES) 22G X 1-1/2" 3 ML MISC 1 application by Does not apply route every 14 (fourteen) days. 50 each 0   testosterone cypionate (DEPOTESTOSTERONE CYPIONATE) 200 MG/ML injection INJECT 1 MILLILITER INTO THE MUSCLE EVERY 7 DAYS 4 mL 5   No facility-administered medications prior to visit.     Review of Systems:   Constitutional:   No  weight loss, night sweats,  Fevers, chills, fatigue, or  lassitude.  HEENT:   No headaches,  Difficulty swallowing,  Tooth/dental problems, or  Sore throat,                 No sneezing, itching, ear ache, nasal congestion, post nasal drip,   CV:  No chest pain,  Orthopnea, PND, swelling in lower extremities, anasarca, dizziness, palpitations, syncope.   GI  No heartburn, indigestion, abdominal pain, nausea, vomiting, diarrhea, change in bowel habits, loss of appetite, bloody stools.   Resp: No shortness of breath with exertion or at rest.  No excess mucus, no productive cough,  No non-productive cough,  No coughing up of blood.  No change in color of mucus.  No wheezing.  No chest wall deformity  Skin: no rash or lesions.  GU: no dysuria, change in color of urine, no urgency or frequency.  No flank pain, no hematuria   MS:  No joint pain or swelling.  No decreased range of motion.  No back pain.    Physical Exam  BP 118/78 (BP Location: Left Arm, Patient Position: Sitting, Cuff Size: Large)   Pulse 88   Temp 97.9 F (36.6 C) (Oral)   Ht 5\' 6"  (1.676 m)   Wt 242 lb 6.4 oz (110 kg)   SpO2 99% Comment: RA  BMI 39.12 kg/m   GEN: A/Ox3; pleasant , NAD, well nourished    HEENT:  [place]/AT,  , NOSE-clear, THROAT-clear, no lesions, no postnasal drip or exudate noted.  Class III-IV MP airway  NECK:  Supple w/ fair ROM; no JVD; normal carotid impulses w/o bruits; no thyromegaly or nodules palpated; no lymphadenopathy.    RESP  Clear  P & A; w/o, wheezes/ rales/ or rhonchi. no accessory muscle use, no dullness to percussion  CARD:  RRR, no m/r/g, no peripheral edema, pulses intact, no cyanosis or clubbing.  GI:   Soft & nt; nml bowel sounds; no organomegaly or masses detected.   Musco: Warm bil, no deformities or joint swelling noted.   Neuro: alert, no focal deficits noted.    Skin: Warm, no lesions or rashes    Lab Results:  CBC    Component Value Date/Time   WBC 8.9 11/13/2022 0738   RBC 5.64 11/13/2022 0738   HGB 16.2 11/13/2022 0738   HCT 49.0 11/13/2022 0738   PLT 227.0 11/13/2022 0738   MCV 86.8 11/13/2022 0738   MCH 28.3  03/27/2021 1132   MCHC 33.0 11/13/2022 0738   RDW 15.2 11/13/2022 0738   LYMPHSABS 1.8 11/13/2022 0738   MONOABS 0.6 11/13/2022 0738   EOSABS 0.3 11/13/2022 0738   BASOSABS 0.0 11/13/2022 0738    BMET    Component Value Date/Time   NA 138 11/13/2022 0738   K 4.1 11/13/2022 0738   CL 101 11/13/2022 0738   CO2 29 11/13/2022 0738   GLUCOSE 97 11/13/2022 0738   BUN 11 11/13/2022 0738   CREATININE 1.37 11/13/2022 0738   CREATININE 1.35  11/07/2020 1613   CALCIUM 9.6 11/13/2022 0738   GFRNONAA >60 03/27/2021 1132   GFRNONAA 64 11/07/2020 1613   GFRAA 75 11/07/2020 1613    BNP No results found for: "BNP"  ProBNP No results found for: "PROBNP"  Imaging: No results found.        No data to display          No results found for: "NITRICOXIDE"      Assessment & Plan:   Snoring Snoring, daytime sleepiness, restless sleep, BMI 39 all concerning for sleep apnea.  Set patient up for home sleep study.  Patient education was given - discussed how weight can impact sleep and risk for sleep disordered breathing - discussed options to assist with weight loss: combination of diet modification, cardiovascular and strength training exercises   - had an extensive discussion regarding the adverse health consequences related to untreated sleep disordered breathing - specifically discussed the risks for hypertension, coronary artery disease, cardiac dysrhythmias, cerebrovascular disease, and diabetes - lifestyle modification discussed   - discussed how sleep disruption can increase risk of accidents, particularly when driving - safe driving practices were discussed   Plan  Patient Instructions  Set up home sleep study  Work on healthy weight loss  Do  not drive if sleepy  Healthy sleep regimen  Follow up in 3 months to discuss sleep results and treatment plan     Morbid obesity ([place]) Healthy weight loss     Rexene Edison, NP 12/11/2022

## 2022-12-11 NOTE — Assessment & Plan Note (Signed)
Healthy weight loss 

## 2022-12-11 NOTE — Telephone Encounter (Signed)
Physical Form for Employment to be filled out--placed in dr's folder.  Call [phone] upon completion for pick up.

## 2022-12-11 NOTE — Patient Instructions (Signed)
Set up home sleep study  Work on healthy weight loss  Do  not drive if sleepy  Healthy sleep regimen  Follow up in 3 months to discuss sleep results and treatment plan

## 2022-12-11 NOTE — Assessment & Plan Note (Signed)
Snoring, daytime sleepiness, restless sleep, BMI 39 all concerning for sleep apnea.  Set patient up for home sleep study.  Patient education was given - discussed how weight can impact sleep and risk for sleep disordered breathing - discussed options to assist with weight loss: combination of diet modification, cardiovascular and strength training exercises   - had an extensive discussion regarding the adverse health consequences related to untreated sleep disordered breathing - specifically discussed the risks for hypertension, coronary artery disease, cardiac dysrhythmias, cerebrovascular disease, and diabetes - lifestyle modification discussed   - discussed how sleep disruption can increase risk of accidents, particularly when driving - safe driving practices were discussed   Plan  Patient Instructions  Set up home sleep study  Work on healthy weight loss  Do  not drive if sleepy  Healthy sleep regimen  Follow up in 3 months to discuss sleep results and treatment plan

## 2022-12-11 NOTE — Telephone Encounter (Signed)
Pt Form has been received and placed on Dr Sarajane Jews red folder

## 2022-12-11 NOTE — Progress Notes (Signed)
Reviewed and agree with assessment/plan.   Chesley Mires, MD [facility] Pulmonary/Critical Care 12/11/2022, 10:23 AM Pager:  [phone]

## 2022-12-13 NOTE — Telephone Encounter (Signed)
Pt has been completed and pt notified to pick up at the office front desk. Form placed in the front office cabinet for pick up

## 2022-12-20 ENCOUNTER — Ambulatory Visit: Payer: 59 | Admitting: Family Medicine

## 2022-12-20 ENCOUNTER — Encounter: Payer: Self-pay | Admitting: Family Medicine

## 2022-12-20 VITALS — BP 128/86 | HR 89 | Temp 97.5°F | Wt 238.0 lb

## 2022-12-20 DIAGNOSIS — I10 Essential (primary) hypertension: Secondary | ICD-10-CM | POA: Insufficient documentation

## 2022-12-20 MED ORDER — LOSARTAN POTASSIUM 50 MG PO TABS: 50.0000 mg | ORAL_TABLET | Freq: Every day | ORAL | 3 refills | Status: DC

## 2022-12-20 NOTE — Progress Notes (Signed)
   Subjective:    Patient ID: Brent Watson, male    DOB: 07/11/1976, 47 y.o.   MRN: [id number]  HPI Here to follow up on HT . He feels great. He is trying to lose weight.    Review of Systems  Constitutional: Negative.   Respiratory: Negative.    Cardiovascular: Negative.        Objective:   Physical Exam Constitutional:      Appearance: He is obese.  Cardiovascular:     Rate and Rhythm: Normal rate and regular rhythm.     Pulses: Normal pulses.     Heart sounds: Normal heart sounds.  Pulmonary:     Effort: Pulmonary effort is normal.     Breath sounds: Normal breath sounds.  Musculoskeletal:     Right lower leg: No edema.     Left lower leg: No edema.  Neurological:     Mental Status: He is alert.           Assessment & Plan:  His HTN is well controlled. Stay on Losartan. Alysia Penna, MD

## 2022-12-21 ENCOUNTER — Ambulatory Visit: Payer: 59 | Admitting: Family Medicine

## 2023-01-31 DIAGNOSIS — R0683 Snoring: Secondary | ICD-10-CM

## 2023-02-01 ENCOUNTER — Ambulatory Visit (INDEPENDENT_AMBULATORY_CARE_PROVIDER_SITE_OTHER): Payer: 59 | Admitting: Adult Health

## 2023-02-01 DIAGNOSIS — R0683 Snoring: Secondary | ICD-10-CM

## 2023-02-13 ENCOUNTER — Telehealth: Payer: Self-pay | Admitting: Adult Health

## 2023-02-13 NOTE — Progress Notes (Signed)
ATC x1.  Left detailed message to call office regarding sleep study results/recommendations as well as schedule OV/virtual visit.

## 2023-02-13 NOTE — Telephone Encounter (Signed)
Went over sleep study results with patient. Virtual apt has been scheduled for April 16th with Tammy. Patient also scheduled f/u in June. Please keep apt just incase patient is unable to keep apt on 16th. He states he will call back if this wont work. Closing encounter for now. NFN

## 2023-02-14 ENCOUNTER — Ambulatory Visit: Payer: 59 | Admitting: Adult Health

## 2023-02-26 ENCOUNTER — Telehealth: Payer: 59 | Admitting: Adult Health

## 2023-03-19 ENCOUNTER — Telehealth: Payer: 59 | Admitting: Adult Health

## 2023-03-29 ENCOUNTER — Encounter: Payer: Self-pay | Admitting: Adult Health

## 2023-03-29 ENCOUNTER — Telehealth (INDEPENDENT_AMBULATORY_CARE_PROVIDER_SITE_OTHER): Payer: 59 | Admitting: Adult Health

## 2023-03-29 ENCOUNTER — Telehealth: Payer: Self-pay | Admitting: Family Medicine

## 2023-03-29 DIAGNOSIS — L0292 Furuncle, unspecified: Secondary | ICD-10-CM

## 2023-03-29 DIAGNOSIS — G4733 Obstructive sleep apnea (adult) (pediatric): Secondary | ICD-10-CM | POA: Insufficient documentation

## 2023-03-29 NOTE — Telephone Encounter (Signed)
Pt states he came in for an OV back in January 2024 to address some bumps on his scalp.  Pt states these bumps are very painful and he would like to request a referral to see a Dermatologist.  Please advise.  LOV:  12/20/22

## 2023-03-29 NOTE — Telephone Encounter (Signed)
I did the referral 

## 2023-03-29 NOTE — Patient Instructions (Signed)
Work on healthy weight loss  Do  not drive if sleepy  Avoid sleeping on your back Healthy sleep regimen  Call back if you change your mind on oral appliance referral Follow up in 6 months and As needed

## 2023-03-29 NOTE — Progress Notes (Signed)
Virtual Visit via Video Note  I connected with Brent Watson Kitchen on 03/29/23 at  4:00 PM EDT by a video enabled telemedicine application and verified that I am speaking with the correct person using two identifiers.  Location: Patient: Home  Provider: Office    I discussed the limitations of evaluation and management by telemedicine and the availability of in person appointments. The patient expressed understanding and agreed to proceed.  History of Present Illness: 47 year old male seen for sleep consult December 11, 2022 for snoring and daytime sleepiness found to have mild sleep apnea  Today's video visit is to review sleep study results.  Patient was seen in January for a sleep consult for snoring and daytime sleepiness.  Patient was set up for home sleep study was done on January 15, 2023 that showed mild sleep apnea with AHI 9.3/hour and SpO2 low at 83%.  We discussed his sleep study results in detail went over treatment options.  Patient says since he was seen he has lost 20 pounds.  Says that his snoring has gotten much better with weight loss.  We went over weight loss, oral appliance and CPAP therapy for treatment plan.  Patient says he wants to continue with his weight loss.  Hold off on oral appliance referral at this time.  We also discussed positional sleep and avoidance of sleeping on his back.  Patient says since his weight loss he also feels that he has been sleeping better and not as tired  Past Medical History:  Diagnosis Date   Allergy    GERD (gastroesophageal reflux disease)    Hyperlipidemia    Hypogonadism in male    Current Outpatient Medications on File Prior to Visit  Medication Sig Dispense Refill   albuterol (VENTOLIN HFA) 108 (90 Base) MCG/ACT inhaler Inhale 2 puffs into the lungs every 4 (four) hours as needed for wheezing or shortness of breath. 18 each 5   atorvastatin (LIPITOR) 20 MG tablet Daily 90 tablet 3   loratadine (CLARITIN) 10 MG tablet Take 1 tablet (10  mg total) by mouth daily. 30 tablet 11   losartan (COZAAR) 50 MG tablet Take 1 tablet (50 mg total) by mouth daily. 90 tablet 3   Multiple Vitamin (MULTIVITAMIN) tablet Take 1 tablet by mouth daily. Reported on 02/14/2016     pantoprazole (PROTONIX) 40 MG tablet Take 1 tablet (40 mg total) by mouth daily. 90 tablet 3   SYRINGE-NEEDLE, DISP, 3 ML (LUER LOCK SAFETY SYRINGES) 22G X 1-1/2" 3 ML MISC 1 application by Does not apply route every 14 (fourteen) days. 50 each 0   testosterone cypionate (DEPOTESTOSTERONE CYPIONATE) 200 MG/ML injection INJECT 1 MILLILITER INTO THE MUSCLE EVERY 7 DAYS 4 mL 5   No current facility-administered medications on file prior to visit.      Observations/Objective: Appears well in no acute distress  Assessment and Plan: Mild obstructive sleep apnea.-We discussed his sleep study results in detail.  Patient education on sleep apnea.  We went over treatment options including weight loss, oral appliance and CPAP therapy.  At this time patient was to continue with positional sleep and weight loss.  He will call back if he is interested in the oral appliance.  Plan  Patient Instructions  Work on healthy weight loss  Do  not drive if sleepy  Avoid sleeping on your back Healthy sleep regimen  Call back if you change your mind on oral appliance referral Follow up in 6 months and As needed  Follow Up Instructions:    I discussed the assessment and treatment plan with the patient. The patient was provided an opportunity to ask questions and all were answered. The patient agreed with the plan and demonstrated an understanding of the instructions.   The patient was advised to call back or seek an in-person evaluation if the symptoms worsen or if the condition fails to improve as anticipated.  I provided 20  minutes of non-face-to-face time during this encounter.   Rubye Oaks, NP

## 2023-04-01 ENCOUNTER — Encounter: Payer: Self-pay | Admitting: Family Medicine

## 2023-04-01 NOTE — Telephone Encounter (Signed)
Pt requesting to speak with someone regarding the painful bumps on his head. Aware that referral for derm has been placed

## 2023-04-01 NOTE — Telephone Encounter (Signed)
Call in Doxycycline 100 mg BID, #60 with one refill

## 2023-04-01 NOTE — Telephone Encounter (Signed)
Pt is calling back and would like a callback and abx also send to  [id number] - [place], [place] - [address] Phone: [phone]  Fax: [phone]

## 2023-04-02 ENCOUNTER — Other Ambulatory Visit: Payer: Self-pay

## 2023-04-02 MED ORDER — DOXYCYCLINE HYCLATE 100 MG PO TABS: 100.0000 mg | ORAL_TABLET | Freq: Two times a day (BID) | ORAL | 1 refills | Status: DC

## 2023-04-02 NOTE — Telephone Encounter (Signed)
Rx sent and pt notified.

## 2023-04-02 NOTE — Telephone Encounter (Signed)
Spoke with pt aware that Dr Clent Ridges sent in Doxycycline to is pharmacy

## 2023-04-16 ENCOUNTER — Ambulatory Visit: Payer: 59 | Admitting: Adult Health

## 2023-05-21 ENCOUNTER — Other Ambulatory Visit: Payer: Self-pay | Admitting: Family Medicine

## 2023-07-16 ENCOUNTER — Other Ambulatory Visit: Payer: Self-pay | Admitting: Family Medicine

## 2023-07-22 MED ORDER — DOXYCYCLINE HYCLATE 100 MG PO TABS: 100.0000 mg | ORAL_TABLET | Freq: Two times a day (BID) | ORAL | 3 refills | Status: DC

## 2023-07-22 NOTE — Telephone Encounter (Signed)
Call in Doxycycline 100 mg BID, #180 with 3 rf

## 2023-11-12 ENCOUNTER — Encounter: Payer: 59 | Admitting: Family Medicine

## 2023-11-29 ENCOUNTER — Ambulatory Visit (INDEPENDENT_AMBULATORY_CARE_PROVIDER_SITE_OTHER): Payer: 59 | Admitting: Family Medicine

## 2023-11-29 ENCOUNTER — Encounter: Payer: Self-pay | Admitting: Family Medicine

## 2023-11-29 VITALS — BP 146/98 | HR 79 | Temp 97.8°F | Ht 66.0 in | Wt 231.0 lb

## 2023-11-29 DIAGNOSIS — E291 Testicular hypofunction: Secondary | ICD-10-CM

## 2023-11-29 DIAGNOSIS — Z0000 Encounter for general adult medical examination without abnormal findings: Secondary | ICD-10-CM | POA: Diagnosis not present

## 2023-11-29 LAB — CBC WITH DIFFERENTIAL/PLATELET
Basophils Absolute: 0 10*3/uL (ref 0.0–0.1)
Basophils Relative: 0.5 % (ref 0.0–3.0)
Eosinophils Absolute: 0.1 10*3/uL (ref 0.0–0.7)
Eosinophils Relative: 2.1 % (ref 0.0–5.0)
HCT: 44.9 % (ref 39.0–52.0)
Hemoglobin: 14.5 g/dL (ref 13.0–17.0)
Lymphocytes Relative: 21.5 % (ref 12.0–46.0)
Lymphs Abs: 1.2 10*3/uL (ref 0.7–4.0)
MCHC: 32.3 g/dL (ref 30.0–36.0)
MCV: 88.9 fL (ref 78.0–100.0)
Monocytes Absolute: 0.3 10*3/uL (ref 0.1–1.0)
Monocytes Relative: 5.6 % (ref 3.0–12.0)
Neutro Abs: 4 10*3/uL (ref 1.4–7.7)
Neutrophils Relative %: 70.3 % (ref 43.0–77.0)
Platelets: 270 10*3/uL (ref 150.0–400.0)
RBC: 5.05 Mil/uL (ref 4.22–5.81)
RDW: 14.3 % (ref 11.5–15.5)
WBC: 5.7 10*3/uL (ref 4.0–10.5)

## 2023-11-29 LAB — BASIC METABOLIC PANEL
BUN: 15 mg/dL (ref 6–23)
CO2: 32 meq/L (ref 19–32)
Calcium: 10.1 mg/dL (ref 8.4–10.5)
Chloride: 103 meq/L (ref 96–112)
Creatinine, Ser: 1.23 mg/dL (ref 0.40–1.50)
GFR: 71.08 mL/min (ref >60.00)
Glucose, Bld: 92 mg/dL (ref 70–99)
Potassium: 4 meq/L (ref 3.5–5.1)
Sodium: 142 meq/L (ref 135–145)

## 2023-11-29 LAB — LIPID PANEL
Cholesterol: 203 mg/dL — ABNORMAL HIGH (ref 0–200)
HDL: 41.6 mg/dL (ref >39.00)
LDL Cholesterol: 146 mg/dL — ABNORMAL HIGH (ref 0–99)
NonHDL: 161.63
Total CHOL/HDL Ratio: 5
Triglycerides: 76 mg/dL (ref 0.0–149.0)
VLDL: 15.2 mg/dL (ref 0.0–40.0)

## 2023-11-29 LAB — PSA: PSA: 0.41 ng/mL (ref 0.10–4.00)

## 2023-11-29 LAB — HEPATIC FUNCTION PANEL
ALT: 18 U/L (ref 0–53)
AST: 16 U/L (ref 0–37)
Albumin: 5 g/dL (ref 3.5–5.2)
Alkaline Phosphatase: 40 U/L (ref 39–117)
Bilirubin, Direct: 0.2 mg/dL (ref 0.0–0.3)
Total Bilirubin: 0.8 mg/dL (ref 0.2–1.2)
Total Protein: 7.8 g/dL (ref 6.0–8.3)

## 2023-11-29 LAB — TSH: TSH: 1.58 u[IU]/mL (ref 0.35–5.50)

## 2023-11-29 LAB — HEMOGLOBIN A1C: Hgb A1c MFr Bld: 6.7 % — ABNORMAL HIGH (ref 4.6–6.5)

## 2023-11-29 LAB — TESTOSTERONE: Testosterone: 328.5 ng/dL (ref 300.00–890.00)

## 2023-11-29 MED ORDER — TESTOSTERONE CYPIONATE 200 MG/ML IM SOLN: 200.0000 mg | INTRAMUSCULAR | 5 refills | Status: DC

## 2023-11-29 MED ORDER — ALBUTEROL SULFATE HFA 108 (90 BASE) MCG/ACT IN AERS: 2.0000 | INHALATION_SPRAY | RESPIRATORY_TRACT | 5 refills | Status: DC | PRN

## 2023-11-29 MED ORDER — AMLODIPINE BESYLATE 5 MG PO TABS: 5.0000 mg | ORAL_TABLET | Freq: Every day | ORAL | 3 refills | Status: DC

## 2023-11-29 MED ORDER — ATORVASTATIN CALCIUM 20 MG PO TABS: ORAL_TABLET | ORAL | 3 refills | Status: DC

## 2023-11-29 NOTE — Progress Notes (Signed)
Subjective:    Patient ID: [name] Kitchen, male    DOB: 1976/05/19, 48 y.o.   MRN: [id number]  HPI Here for a well exam. He feels well but he is concerned about his BP. He had been taking Losartan with excellent BP control, but he says it dropped his heart rate down so slow that he felt bad. One time his rate dropped into the 30's. He decided to stop the Losartan one month, and now he feels fine again. However the BP did go back  up.    Review of Systems  Constitutional: Negative.   HENT: Negative.    Eyes: Negative.   Respiratory: Negative.    Cardiovascular: Negative.   Gastrointestinal: Negative.   Genitourinary: Negative.   Musculoskeletal: Negative.   Skin: Negative.   Neurological: Negative.   Psychiatric/Behavioral: Negative.         Objective:   Physical Exam Constitutional:      General: He is not in acute distress.    Appearance: Normal appearance. He is well-developed. He is not diaphoretic.  HENT:     Head: Normocephalic and atraumatic.     Right Ear: External ear normal.     Left Ear: External ear normal.     Nose: Nose normal.     Mouth/Throat:     Pharynx: No oropharyngeal exudate.  Eyes:     General: No scleral icterus.       Right eye: No discharge.        Left eye: No discharge.     Conjunctiva/sclera: Conjunctivae normal.     Pupils: Pupils are equal, round, and reactive to light.  Neck:     Thyroid: No thyromegaly.     Vascular: No JVD.     Trachea: No tracheal deviation.  Cardiovascular:     Rate and Rhythm: Normal rate and regular rhythm.     Pulses: Normal pulses.     Heart sounds: Normal heart sounds. No murmur heard.    No friction rub. No gallop.  Pulmonary:     Effort: Pulmonary effort is normal. No respiratory distress.     Breath sounds: Normal breath sounds. No wheezing or rales.  Chest:     Chest wall: No tenderness.  Abdominal:     General: Bowel sounds are normal. There is no distension.     Palpations: Abdomen is soft.  There is no mass.     Tenderness: There is no abdominal tenderness. There is no guarding or rebound.  Genitourinary:    Penis: Normal. No tenderness.      Testes: Normal.     Prostate: Normal.     Rectum: Normal. Guaiac result negative.  Musculoskeletal:        General: No tenderness. Normal range of motion.     Cervical back: Neck supple.  Lymphadenopathy:     Cervical: No cervical adenopathy.  Skin:    General: Skin is warm and dry.     Coloration: Skin is not pale.     Findings: No erythema or rash.  Neurological:     General: No focal deficit present.     Mental Status: He is alert and oriented to person, place, and time.     Cranial Nerves: No cranial nerve deficit.     Motor: No abnormal muscle tone.     Coordination: Coordination normal.     Deep Tendon Reflexes: Reflexes are normal and symmetric. Reflexes normal.  Psychiatric:        Mood and  Affect: Mood normal.        Behavior: Behavior normal.        Thought Content: Thought content normal.        Judgment: Judgment normal.           Assessment & Plan:  Well exam. We discussed diet and exercise. Get fasting labs. For the HTN, we agreed to stay off Losartan, and instead he will try Amlodipine 5 mg daily. He will report back in 4 weeks. Gershon Crane, MD

## 2023-12-04 ENCOUNTER — Other Ambulatory Visit: Payer: Self-pay | Admitting: *Deleted

## 2023-12-04 DIAGNOSIS — E785 Hyperlipidemia, unspecified: Secondary | ICD-10-CM

## 2023-12-24 ENCOUNTER — Ambulatory Visit: Payer: 59 | Admitting: Dermatology

## 2024-02-24 ENCOUNTER — Other Ambulatory Visit: Payer: 59

## 2024-02-25 ENCOUNTER — Other Ambulatory Visit (INDEPENDENT_AMBULATORY_CARE_PROVIDER_SITE_OTHER)

## 2024-02-25 DIAGNOSIS — E785 Hyperlipidemia, unspecified: Secondary | ICD-10-CM

## 2024-02-25 LAB — LIPID PANEL
Cholesterol: 150 mg/dL (ref 0–200)
HDL: 44.7 mg/dL (ref >39.00)
LDL Cholesterol: 88 mg/dL (ref 0–99)
NonHDL: 105.39
Total CHOL/HDL Ratio: 3
Triglycerides: 89 mg/dL (ref 0.0–149.0)
VLDL: 17.8 mg/dL (ref 0.0–40.0)

## 2024-03-12 ENCOUNTER — Encounter: Payer: Self-pay | Admitting: *Deleted

## 2024-05-23 ENCOUNTER — Other Ambulatory Visit: Payer: Self-pay | Admitting: Family Medicine

## 2024-11-18 ENCOUNTER — Ambulatory Visit: Admitting: Family Medicine

## 2024-11-18 ENCOUNTER — Encounter: Payer: Self-pay | Admitting: Family Medicine

## 2024-11-18 VITALS — BP 138/98 | HR 76 | Temp 98.2°F | Ht 66.0 in | Wt 242.0 lb

## 2024-11-18 DIAGNOSIS — J020 Streptococcal pharyngitis: Secondary | ICD-10-CM

## 2024-11-18 DIAGNOSIS — J029 Acute pharyngitis, unspecified: Secondary | ICD-10-CM

## 2024-11-18 DIAGNOSIS — R059 Cough, unspecified: Secondary | ICD-10-CM

## 2024-11-18 LAB — POCT RAPID STREP A (OFFICE): Rapid Strep A Screen: POSITIVE — AB

## 2024-11-18 LAB — POC COVID19 BINAXNOW: SARS Coronavirus 2 Ag: NEGATIVE

## 2024-11-18 MED ORDER — AMOXICILLIN 875 MG PO TABS: 875.0000 mg | ORAL_TABLET | Freq: Two times a day (BID) | ORAL | 0 refills | Status: AC

## 2024-11-18 NOTE — Progress Notes (Signed)
 "  Acute Office Visit  Subjective:     Patient ID: Brent Watson, male    DOB: [birth year], 49 y.o.   MRN: [id number]  Chief Complaint  Patient presents with   Cough    Non-productive X1 day, chest pain and congestion x2 weeks   Sore Throat    X2 days,tried salt water gargles with some relief    Cough  Sore Throat  Associated symptoms include coughing.   Discussed the use of AI scribe software for clinical note transcription with the patient, who gave verbal consent to proceed.  History of Present Illness   Brent Watson is a 49 year old male with exercise-induced asthma who presents with chest congestion and cough.  He reports chest congestion for the past couple of weeks, which recurs with weather changes. Robitussin with honey and Advil Sinus have kept symptoms mild.  Over the past few days he developed more cough and mucus production, though he has difficulty bringing mucus up. He has seasonal allergies and typically has similar symptoms yearly. He denies fever or chills. He has used albuterol  more frequently in the last couple of weeks for heavy breathing with exertion, especially climbing stairs, with improvement after use.  Since Monday he has had a sore throat that worsened last night, described as a burning sensation with inspiration. Sleeping upright and salt water gargles provided some relief.  He has been using albuterol  more than usual for his exercise-induced asthma. He has not been taking his blood pressure medication recently because he prefers to avoid long-term medication.  He recalls an unspecified antibiotic causing nausea but has tolerated Zithromax  and doxycycline  in the past without problems.       Review of Systems  Respiratory:  Positive for cough.         Objective:    BP (!) 138/98   Pulse 76   Temp 98.2 F (36.8 C) (Oral)   Ht 5' 6 (1.676 m)   Wt 242 lb (109.8 kg)   SpO2 98%   BMI 39.06 kg/m    Physical Exam Vitals reviewed.   Constitutional:      Appearance: He is well-developed. He is obese.  HENT:     Right Ear: Tympanic membrane normal.     Left Ear: Tympanic membrane normal.     Nose: No congestion or rhinorrhea.     Mouth/Throat:     Mouth: Mucous membranes are moist.     Pharynx: Posterior oropharyngeal erythema present.  Cardiovascular:     Rate and Rhythm: Normal rate and regular rhythm.     Heart sounds: Normal heart sounds. No murmur heard. Pulmonary:     Effort: Pulmonary effort is normal.     Breath sounds: Normal breath sounds. No wheezing.  Abdominal:     General: Bowel sounds are normal.  Neurological:     Mental Status: He is alert and oriented to person, place, and time.  Psychiatric:        Mood and Affect: Mood normal.     Results for orders placed or performed in visit on 11/18/24  POC COVID-19  Result Value Ref Range   SARS Coronavirus 2 Ag Negative Negative  POC Rapid Strep A  Result Value Ref Range   Rapid Strep A Screen Positive (A) Negative        Assessment & Plan:   Problem List Items Addressed This Visit   None Visit Diagnoses       Cough, unspecified type    -  Primary   Relevant Orders   POC COVID-19 (Completed)   POC Rapid Strep A (Completed)     Sore throat       Relevant Orders   POC COVID-19 (Completed)   POC Rapid Strep A (Completed)     Strep pharyngitis       Relevant Medications   amoxicillin  (AMOXIL ) 875 MG tablet     Assessment and Plan    Streptococcal pharyngitis Positive strep test with sore throat and cough. No fever or chills. COVID test negative. Lungs clear with no wheezing or crackles. Cough likely due to throat irritation. No known antibiotic allergies, but previous nausea with doxycycline . - Prescribed antibiotic for 10 days to eradicate strep from throat. - Advised to take two doses of antibiotic today and continue twice daily. - Instructed to finish the full course of antibiotics even if symptoms improve. - Advised to use  hand sanitizer to prevent transmission. - Offered work excuse if needed, but he declined.  Exercise induced asthma Increased use of albuterol  over the past couple of weeks. No current wheezing or crackles. Symptoms improve with albuterol  use. - Continue albuterol  as needed for symptom relief.        Meds ordered this encounter  Medications   amoxicillin  (AMOXIL ) 875 MG tablet    Sig: Take 1 tablet (875 mg total) by mouth 2 (two) times daily for 10 days.    Dispense:  20 tablet    Refill:  0    No follow-ups on file.  Heron CHRISTELLA Sharper, MD   "

## 2024-11-29 ENCOUNTER — Other Ambulatory Visit: Payer: Self-pay | Admitting: Family Medicine

## 2024-11-30 ENCOUNTER — Encounter: Payer: Self-pay | Admitting: Family Medicine

## 2024-11-30 ENCOUNTER — Ambulatory Visit: Payer: Self-pay | Admitting: Family Medicine

## 2024-11-30 ENCOUNTER — Ambulatory Visit (INDEPENDENT_AMBULATORY_CARE_PROVIDER_SITE_OTHER): Admitting: Family Medicine

## 2024-11-30 VITALS — BP 126/86 | HR 83 | Temp 98.4°F | Ht 66.5 in | Wt 238.0 lb

## 2024-11-30 DIAGNOSIS — Z0000 Encounter for general adult medical examination without abnormal findings: Secondary | ICD-10-CM

## 2024-11-30 LAB — LIPID PANEL
Cholesterol: 169 mg/dL (ref 28–200)
HDL: 39.2 mg/dL
LDL Cholesterol: 118 mg/dL — ABNORMAL HIGH (ref 10–99)
NonHDL: 129.55
Total CHOL/HDL Ratio: 4
Triglycerides: 60 mg/dL (ref 10.0–149.0)
VLDL: 12 mg/dL (ref 0.0–40.0)

## 2024-11-30 LAB — BASIC METABOLIC PANEL WITH GFR
BUN: 15 mg/dL (ref 6–23)
CO2: 30 meq/L (ref 19–32)
Calcium: 10 mg/dL (ref 8.4–10.5)
Chloride: 102 meq/L (ref 96–112)
Creatinine, Ser: 1.25 mg/dL (ref 0.40–1.50)
GFR: 69.22 mL/min
Glucose, Bld: 92 mg/dL (ref 70–99)
Potassium: 4.6 meq/L (ref 3.5–5.1)
Sodium: 139 meq/L (ref 135–145)

## 2024-11-30 LAB — HEPATIC FUNCTION PANEL
ALT: 23 U/L (ref 3–53)
AST: 20 U/L (ref 5–37)
Albumin: 5 g/dL (ref 3.5–5.2)
Alkaline Phosphatase: 44 U/L (ref 39–117)
Bilirubin, Direct: 0.1 mg/dL (ref 0.1–0.3)
Total Bilirubin: 0.7 mg/dL (ref 0.2–1.2)
Total Protein: 7.7 g/dL (ref 6.0–8.3)

## 2024-11-30 LAB — CBC WITH DIFFERENTIAL/PLATELET
Basophils Absolute: 0 K/uL (ref 0.0–0.1)
Basophils Relative: 0.6 % (ref 0.0–3.0)
Eosinophils Absolute: 0.2 K/uL (ref 0.0–0.7)
Eosinophils Relative: 2.5 % (ref 0.0–5.0)
HCT: 44.5 % (ref 39.0–52.0)
Hemoglobin: 14.7 g/dL (ref 13.0–17.0)
Lymphocytes Relative: 23.4 % (ref 12.0–46.0)
Lymphs Abs: 1.5 K/uL (ref 0.7–4.0)
MCHC: 33 g/dL (ref 30.0–36.0)
MCV: 86.4 fl (ref 78.0–100.0)
Monocytes Absolute: 0.5 K/uL (ref 0.1–1.0)
Monocytes Relative: 7.7 % (ref 3.0–12.0)
Neutro Abs: 4.1 K/uL (ref 1.4–7.7)
Neutrophils Relative %: 65.8 % (ref 43.0–77.0)
Platelets: 285 K/uL (ref 150.0–400.0)
RBC: 5.14 Mil/uL (ref 4.22–5.81)
RDW: 14.4 % (ref 11.5–15.5)
WBC: 6.2 K/uL (ref 4.0–10.5)

## 2024-11-30 LAB — TSH: TSH: 1.49 u[IU]/mL (ref 0.35–5.50)

## 2024-11-30 LAB — HEMOGLOBIN A1C: Hgb A1c MFr Bld: 6.7 % — ABNORMAL HIGH (ref 4.6–6.5)

## 2024-11-30 LAB — PSA: PSA: 0.31 ng/mL (ref 0.10–4.00)

## 2024-11-30 MED ORDER — AMLODIPINE BESYLATE 5 MG PO TABS: 5.0000 mg | ORAL_TABLET | Freq: Every day | ORAL | 3 refills | Status: AC

## 2024-11-30 MED ORDER — ATORVASTATIN CALCIUM 20 MG PO TABS: ORAL_TABLET | ORAL | 3 refills | Status: AC

## 2024-11-30 MED ORDER — ALBUTEROL SULFATE HFA 108 (90 BASE) MCG/ACT IN AERS: 2.0000 | INHALATION_SPRAY | RESPIRATORY_TRACT | 5 refills | Status: AC | PRN

## 2024-11-30 NOTE — Progress Notes (Signed)
 "  Subjective:    Patient ID: Brent Watson, male    DOB: Jan 08, 1976, 49 y.o.   MRN: [id number]  HPI Here for a well exam. He feels fine today. He was seen here a few weeks ago for a Strep throat infection, but this resolved with Amoxicillin .    Review of Systems  Constitutional: Negative.   HENT: Negative.    Eyes: Negative.   Respiratory: Negative.    Cardiovascular: Negative.   Gastrointestinal: Negative.   Genitourinary: Negative.   Musculoskeletal: Negative.   Skin: Negative.   Neurological: Negative.   Psychiatric/Behavioral: Negative.         Objective:   Physical Exam Constitutional:      General: He is not in acute distress.    Appearance: He is well-developed. He is obese. He is not diaphoretic.  HENT:     Head: Normocephalic and atraumatic.     Right Ear: External ear normal.     Left Ear: External ear normal.     Nose: Nose normal.     Mouth/Throat:     Pharynx: No oropharyngeal exudate.  Eyes:     General: No scleral icterus.       Right eye: No discharge.        Left eye: No discharge.     Conjunctiva/sclera: Conjunctivae normal.     Pupils: Pupils are equal, round, and reactive to light.  Neck:     Thyroid : No thyromegaly.     Vascular: No JVD.     Trachea: No tracheal deviation.  Cardiovascular:     Rate and Rhythm: Normal rate and regular rhythm.     Pulses: Normal pulses.     Heart sounds: Normal heart sounds. No murmur heard.    No friction rub. No gallop.  Pulmonary:     Effort: Pulmonary effort is normal. No respiratory distress.     Breath sounds: Normal breath sounds. No wheezing or rales.  Chest:     Chest wall: No tenderness.  Abdominal:     General: Bowel sounds are normal. There is no distension.     Palpations: Abdomen is soft. There is no mass.     Tenderness: There is no abdominal tenderness. There is no guarding or rebound.  Genitourinary:    Penis: Normal. No tenderness.      Testes: Normal.     Prostate: Normal.      Rectum: Normal. Guaiac result negative.  Musculoskeletal:        General: No tenderness. Normal range of motion.     Cervical back: Neck supple.  Lymphadenopathy:     Cervical: No cervical adenopathy.  Skin:    General: Skin is warm and dry.     Coloration: Skin is not pale.     Findings: No erythema or rash.  Neurological:     General: No focal deficit present.     Mental Status: He is alert and oriented to person, place, and time.     Cranial Nerves: No cranial nerve deficit.     Motor: No abnormal muscle tone.     Coordination: Coordination normal.     Deep Tendon Reflexes: Reflexes are normal and symmetric. Reflexes normal.  Psychiatric:        Mood and Affect: Mood normal.        Behavior: Behavior normal.        Thought Content: Thought content normal.        Judgment: Judgment normal.  Assessment & Plan:  Well exam. We discussed diet and exercise. Get fasting labs. Refer for a colonoscopy.  Garnette Olmsted, MD    "
